# Patient Record
Sex: Female | Born: 1990 | Race: Asian | Hispanic: No | Marital: Single | Smoking: Never smoker
Health system: Southern US, Community
[De-identification: ages and names within clinical notes are randomized; demographics above are authoritative.]

## PROBLEM LIST (undated history)

## (undated) DIAGNOSIS — Z789 Other specified health status: Secondary | ICD-10-CM

## (undated) HISTORY — PX: NO PAST SURGERIES: SHX2092

## (undated) HISTORY — DX: Other specified health status: Z78.9

---

## 2013-08-23 ENCOUNTER — Ambulatory Visit: Payer: BC Managed Care – PPO | Admitting: Family Medicine

## 2013-08-23 VITALS — BP 102/70 | HR 125 | Temp 99.1°F | Resp 18 | Ht 59.0 in | Wt 107.0 lb

## 2013-08-23 DIAGNOSIS — R1110 Vomiting, unspecified: Secondary | ICD-10-CM

## 2013-08-23 DIAGNOSIS — Z3490 Encounter for supervision of normal pregnancy, unspecified, unspecified trimester: Secondary | ICD-10-CM

## 2013-08-23 DIAGNOSIS — Z331 Pregnant state, incidental: Secondary | ICD-10-CM

## 2013-08-23 DIAGNOSIS — R351 Nocturia: Secondary | ICD-10-CM

## 2013-08-23 LAB — POCT UA - MICROSCOPIC ONLY
Casts, Ur, LPF, POC: NEGATIVE
Crystals, Ur, HPF, POC: NEGATIVE
Mucus, UA: POSITIVE
Yeast, UA: NEGATIVE

## 2013-08-23 LAB — POCT URINALYSIS DIPSTICK
Bilirubin, UA: NEGATIVE
Blood, UA: NEGATIVE
Glucose, UA: NEGATIVE
Ketones, UA: 15
Leukocytes, UA: NEGATIVE
Nitrite, UA: NEGATIVE
Protein, UA: NEGATIVE
Spec Grav, UA: 1.015
Urobilinogen, UA: 0.2
pH, UA: 7

## 2013-08-23 LAB — POCT URINE PREGNANCY: Preg Test, Ur: POSITIVE

## 2013-08-23 MED ORDER — PROMETHAZINE HCL 12.5 MG PO TABS: 12.5000 mg | ORAL_TABLET | Freq: Three times a day (TID) | ORAL | Status: DC | PRN

## 2013-08-23 NOTE — Progress Notes (Addendum)
This chart was scribed for Caitlin Sidle, MD by Quintella Reichert, ED scribe.  This patient was seen in room [facility] Room 13 and the patient's care was started at 12:58 PM.   Patient ID: Caitlin Fuller MRN: [id number], DOB: [birth year], 21 y.o. Date of Encounter: 08/23/2013, 12:59 PM  Primary Physician: No primary provider on file.  Chief Complaint:  Chief Complaint  Patient presents with  . Emesis    vomiting every morning   . Urinary Frequency    urinating mor often at night   . Fatigue    HPI: 22 y.o. year old female presents with 2-3 month history of emesis, fatigue, and urinary frequency.  Pt vomits every morning.  Her breasts have also been swelling.  She denies breast tenderness.  She is not certain whether she is pregnant.  She is sexually active.  She took 2 pregnancy tests but she was not sure how to interpret them.  LNMP was at the end of July.  Her periods are normally regular.  She is not taking prenatal vitamins.   History reviewed. No pertinent past medical history.   Home Meds: Prior to Admission medications   Not on File    Allergies: No Known Allergies  History   Social History  . Marital Status: Single    Spouse Name: N/A    Number of Children: N/A  . Years of Education: N/A   Occupational History  . Not on file.   Social History Main Topics  . Smoking status: Never Smoker   . Smokeless tobacco: Not on file  . Alcohol Use: Not on file  . Drug Use: Not on file  . Sexual Activity: Not on file   Other Topics Concern  . Not on file   Social History Narrative  . No narrative on file     Review of Systems: Constitutional: positive for fatigue.  negative for chills, fever, night sweats or weight changes Cardiovascular: negative for chest pain or palpitations Respiratory: negative for hemoptysis, wheezing, or shortness of breath Abdominal: positive for emesis. negative for abdominal pain or diarrhea Genitourinary: positive for  frequency Musculoskeletal: Positive for breast swelling.  Negative for breast tenderness. Dermatological: negative for rash Neurologic: negative for headache   Physical Exam: Blood pressure 102/70, pulse 125, temperature 99.1 F (37.3 C), temperature source Oral, resp. rate 18, height 4\' 11"  (1.499 m), weight 107 lb (48.535 kg), last menstrual period 04/27/2013, SpO2 100.00%., Body mass index is 21.6 kg/(m^2). General: Well developed, well nourished, in no acute distress. Head: Normocephalic, atraumatic, eyes without discharge, sclera non-icteric, nares are congested. Bilateral auditory canals clear, TM's are without perforation, pearly grey with reflective cone of light bilaterally. Serous effusion bilaterally behind TM's. Maxillary sinus TTP. Oral cavity moist, dentition normal. Posterior pharynx with post nasal drip and mild erythema. No peritonsillar abscess or tonsillar exudate. Neck: Supple. No thyromegaly. Full ROM. No lymphadenopathy. Lungs: Clear bilaterally to auscultation without wheezes, rales, or rhonchi. Breathing is unlabored.  Heart: RRR with S1 S2. No murmurs, rubs, or gallops appreciated. Abdomen: Soft, non-tender, non-distended with normoactive bowel sounds. No hepatosplenomegaly. No rebound/guarding. No obvious abdominal masses. McBurney's, Rovsing's, Iliopsoas, and table jar all negative. Msk:  Strength and tone normal for age. Extremities: No clubbing or cyanosis. No edema. Neuro: Alert and oriented X 3. Moves all extremities spontaneously. CNII-XII grossly in tact. Psych:  Responds to questions appropriately with a normal affect.   Labs: Results for orders placed in visit on 08/23/13  POCT UA - MICROSCOPIC  ONLY      Result Value Range   WBC, Ur, HPF, POC 1-4     RBC, urine, microscopic 1-3     Bacteria, U Microscopic trace     Mucus, UA positive     Epithelial cells, urine per micros 3-8     Crystals, Ur, HPF, POC neg     Casts, Ur, LPF, POC neg     Yeast, UA neg     POCT URINALYSIS DIPSTICK      Result Value Range   Color, UA yellow     Clarity, UA clear     Glucose, UA neg     Bilirubin, UA neg     Ketones, UA 15     Spec Grav, UA 1.015     Blood, UA neg     pH, UA 7.0     Protein, UA neg     Urobilinogen, UA 0.2     Nitrite, UA neg     Leukocytes, UA Negative    POCT URINE PREGNANCY      Result Value Range   Preg Test, Ur Positive       ASSESSMENT AND PLAN:  22 y.o. year old female with pregnancy, unplanned, about 12 weeks Frequent urination at night - Plan: POCT UA - Microscopic Only, POCT urinalysis dipstick, POCT urine pregnancy, Ambulatory referral to Obstetrics / Gynecology, promethazine (PHENERGAN) 12.5 MG tablet  Vomiting - Plan: POCT UA - Microscopic Only, POCT urinalysis dipstick, POCT urine pregnancy, Ambulatory referral to Obstetrics / Gynecology, promethazine (PHENERGAN) 12.5 MG tablet  Pregnancy - Plan: Ambulatory referral to Obstetrics / Gynecology, promethazine (PHENERGAN) 12.5 MG tablet   Prenatal vits Rx written  Signed, Caitlin Sidle, MD 08/23/2013 12:59 PM

## 2013-08-23 NOTE — Patient Instructions (Signed)
[place] K? (Pregnancy) N?u b?n ?ang d? ??nh c [place], t?t nh?t nn s?p x?p m?t cu?c h?n ti?n mang thai v?i chuyn gia ch?m [place] y t? ?? th?o lu?n v? vi?c c m?t l?i s?ng lnh m?nh tr??c khi c [place]. Ch?ng h?n nh?, ch? ?? ?n u?ng, cn n?ng, t?p luy?n, u?ng vitamin tr??c khi sinh ??c bi?t l axit folic (gip ng?n ng?a khuy?t t?t no v t?y s?ng); trnh u?ng r??u, ht thu?c l v s? d?ng ma ty b?t h?p php; cc v?n ?? y t? (b?nh ti?u ???ng, co gi?t), ti?n s? gia ?nh c cc v?n ?? di truy?n, ?i?u ki?n lm vi?c v ch?ng ng?a. T?t h?n nn c ki?n th?c v? nh?ng ?i?u ny v c hnh ??ng ??i v?i chng tr??c khi mang [place].  Trong th?i k? mang [place], ?i?u quan tr?ng l tun theo nh?ng nguyn t?c nh?t ??nh ?? c m?t em b kh?e m?nh. ?i?u r?t quan tr?ng l c s? ch?m [place] t?t tr??c khi sinh v tun theo h??ng d?n c?a chuyn gia ch?m [place] y t?[place] thai bao g?m t?t c? cc ch?m [place] y t? b?n nh?n tr??c khi sinh. ?i?u ny gip phng ng?a cc v?n ?? trong qu trnh mang thai v sinh con.  H??NG D?N CH?M  T?I NH  B?t ??u cc l?n khm thai vo tu?n th? 12 c?a thai k? hay tr??c ? khi c th?. Cc ??t khm thai ny th??ng ???c ln l?ch hng thng tr??c. Trong 2 thng cu?i tr??c khi sinh, cc ??t khm thai ny th??ng xuyn h?n. ?i?u quan tr?ng l gi? ?ng l?ch h?n khm thai v?i Bc s? v tun theo cc ch? d?n c?a h? v? s? d?ng thu?c, cch v?n ??ng, v ch? ?? ?n.  Trong thai k?, cung c?p th?c ?n cho c? m? v b. ?n theo m?t ch? ?? ?n cn ??i, ??y ??. Ch?n cc lo?i th?c ph?m nh? th?t, c, s?a v cc s?n ph?m khc t? s?a, rau, tri cy, bnh m v th?c ?n lm t? h?t ng? c?c nguyn h?t. Bc s? s? cho bi?t m?c t?ng cn l t??ng d?a vo chi?u cao v ch? s? cn n?ng hi?n t?i. U?ng nhi?u n??c. C? g?ng u?ng tm ly n??c m?i ngy.  R??u c lin quan ??n m?t s? d? t?t b?m sinh k? c? h?i ch?ng l? thu?c r??u c?n c?a [place] nhi. T?t nh?t l tuy?t ??i trnh u?ng r??u. Ht thu?c l s? lm em b sinh ra nh? cn v sinh non. S? d?ng r??u v ch?t  nictin trong [place] k? c?ng lm t?ng ?ng k? kh? n?ng em b sinh ra s? b? l? thu?c vo cc ch?t ny sau khi l?n ln v c th? gp ph?n gy ra h?i ch?ng SIDS (H?i Ch?ng ??t T? Nh? Nhi).  Khng s? d?ng cc ch?t ma ty.  Ch? dng cc thu?c, ???c k toa hay ???c mua m khng c?n ??n thu?c, ???c khuy?n co b?i Bc s?. Cc thu?c khc c th? gy ra cc v?n ?? v? di truy?n v th? ch?t cho em b.  Th??ng c th? gi?m b?t ch?ng m?t m?i vo bu?i sng b?ng cch ?? bnh quy m?n c?nh gi??ng. ?n vi ci tr??c khi r?i kh?i gi??ng vo bu?i sng.  C th? ti?p t?c quan h? tnh d?c cho ??n g?n ngy sinh n?u khng c cc v?n ?? khc nh? r? (n??c ?i) t? mng ?i s?m (non), xu?t huy?t m ??o, giao h?p ?au hay ?  au b?ng.  V?n ??ng th??ng xuyn. Hy ki?m tra l?i v?i Bc s? n?u khng ch?c ch?n v? ?? an ton c?a m?t vi bi t?p v?n ??ng.  Khng dng b?n t?m nng, phng t?m h?i, v xng h?i. Nh?ng th? ny lm t?ng nguy c? b? chong ng?t hay b?t t?nh v gy t?n h?i ??n chnh b?n thn ng??i m? v em b. B?i l?i th t?t cho v?n ??ng thn th?. Ngh? ng?i nhi?u k? c? ch?p m?t vo bu?i chi?u khi c th?, ??c bi?t l vo ba thng cu?i [place] k?.  Trnh cc mi v ha ch?t ??c h?i.  Khng mang giy cao gt, chng c th? gy m?t th?ng b?ng v t ng.  ??ng nng v?t n?ng trn 5 pao. N?u nng nh?c b?t k? th? g, hy nng n ln b?ng l?c c?a chn v ?i, khng dng l?c ? l?ng.  Trnh nh?ng chuy?n ?i xa, ??c bi?t l vo ba thng cu?i [place] k?.  N?u ph?i ?i kh?i thnh ph? hay ti?u bang, nn mang theo b?n sao cc h? s? y t?. NH?P VI?N NGAY L?P T?C N?U:  Nhi?t ?? ?o d??i l??i cao trn 102 F (38.9 C) m khng r nguyn do, hay theo ?? ngh? c?a Bc s?.  Ch?y d?ch t? m ??o. N?u nghi ng? r? mng ?i, hy ?o thn nhi?t v thng bo cho Bc s? v? tr? s? nhi?t ?? ?o ???c khi g?i ?i?n tho?i cho h?.  C v?t mu hay xu?t huy?t m ??o. Bo cho Bc s? v? l??ng mu ch?y ra v s? b?ng v? sinh ? ???c s? d?ng.  Ti?p t?c b? i m?a v khng gi?m d ?  u?ng cc thu?c ???c ?? ngh?, hay i ra mu t??i ho?c i ra ch?t gi?ng nh? b c ph.  ?au b?ng vng trn r?n (th??ng v?).  Kh ch?u vng dy ch?ng trn ? vng b?ng d??i l nguyn nhn lnh tnh ph? bi?n gy ra ?au b?ng trong [place] k?. ?i?u ny v?n ph?i ???c Bc s? th?m khm.  C?m nh?n ???c cc c?n g t? cung.  Khng c?m nh?n ???c cc chuy?n ??ng c?a em b, hay chuy?n ??ng t h?n tr??c.  Ti?u ?au.  B? huy?t tr?ng b?t th??ng ? m ??o.  Tiu ch?y lin t?c.  ?au ??u d? d?i.  C cc v?n ?? v? th? gic.  B? y?u c?.  C?m th?y hoa m?t v chng m?t.  Kh th?.  ?au ng?c.  ?au l?ng, di chuy?n xu?ng chn v bn chn.  C?m th?y nh?p tim khng ??u hay r?t nhanh.  T?ng cn qu m?c trong th?i gian ng?n (5 pao trong 3 ??n 5 ngy).  B?n lin quan t?i tnh tr?ng b?o l?c gia ?nh. Document Released: 10/14/2005 Document Revised: 01/06/2012 [facility] Patient Information 2014 [place], [place].

## 2013-10-25 ENCOUNTER — Ambulatory Visit: Payer: BC Managed Care – PPO

## 2013-10-28 NOTE — L&D Delivery Note (Signed)
I was present for delivery and agree with note above. Caitlin Fuller,Caitlin Fuller  

## 2013-10-28 NOTE — L&D Delivery Note (Signed)
Delivery Note At 6:04 PM a healthy female was delivered via Vaginal, Spontaneous Delivery (Presentation: Middle Occiput Anterior).  APGAR: 1, 9; weight 7 lb 6.7 oz (3365 g).   Placenta status: Abnormal, Manual removal.  Cord: 3 vessels with the following complications: None.  Cord pH: not obtained  Anesthesia: None  Episiotomy: None Lacerations: 2nd degree;Perineal Suture Repair: 3.0 Monocyrl Est. Blood Loss (500mL):   Precipitous delivery as she advanced quickly from 4 cm to complete. Upon arrival baby's head was crowning and mother pushed with good maternal effort to deliver a healthy female. Baby delivered without difficulty, cord was clamped immediately due to poor infant tone and was taken to warmer. APGAR 1 at 1 minute likely due to fentanyl dose given ~ 30 minutes prior to delivery. NICU team was called, but baby responded well to stimulation and minimal bag ventilation with APGAR 9 at 5 minutes. She had a retained placenta that was manually removed by Dr Shawnie PonsPratt. Placenta was fragmented and required several searches of the uterus to remove retained tissue. 3V cord was noted. She suffered a 2nd degree perineal laceration that was repaired by Dr Shawnie PonsPratt in the usually fashion. EBL was estimated at 500 cc and Cytotec was given. Bleeding was minimal s/p removal of placenta.  Mom to postpartum and baby to skin to skin.   Mom to postpartum.  Baby to Couplet care / Skin to Skin.  Wenda LowJoyner, Juda Lajeunesse 01/11/2014, 7:02 PM

## 2013-11-17 ENCOUNTER — Ambulatory Visit (INDEPENDENT_AMBULATORY_CARE_PROVIDER_SITE_OTHER): Payer: BC Managed Care – PPO | Admitting: Advanced Practice Midwife

## 2013-11-17 ENCOUNTER — Encounter: Payer: Self-pay | Admitting: Advanced Practice Midwife

## 2013-11-17 VITALS — BP 108/76 | Temp 96.7°F | Wt 133.8 lb

## 2013-11-17 DIAGNOSIS — Z113 Encounter for screening for infections with a predominantly sexual mode of transmission: Secondary | ICD-10-CM

## 2013-11-17 DIAGNOSIS — O219 Vomiting of pregnancy, unspecified: Secondary | ICD-10-CM

## 2013-11-17 DIAGNOSIS — Z124 Encounter for screening for malignant neoplasm of cervix: Secondary | ICD-10-CM

## 2013-11-17 DIAGNOSIS — Z23 Encounter for immunization: Secondary | ICD-10-CM

## 2013-11-17 DIAGNOSIS — Z3400 Encounter for supervision of normal first pregnancy, unspecified trimester: Secondary | ICD-10-CM

## 2013-11-17 LAB — POCT URINALYSIS DIP (DEVICE)
BILIRUBIN URINE: NEGATIVE
Glucose, UA: NEGATIVE mg/dL
Hgb urine dipstick: NEGATIVE
KETONES UR: NEGATIVE mg/dL
Nitrite: NEGATIVE
PH: 7 (ref 5.0–8.0)
PROTEIN: NEGATIVE mg/dL
SPECIFIC GRAVITY, URINE: 1.02 (ref 1.005–1.030)
Urobilinogen, UA: 1 mg/dL (ref 0.0–1.0)

## 2013-11-17 LAB — HIV ANTIBODY (ROUTINE TESTING W REFLEX): HIV: NONREACTIVE

## 2013-11-17 MED ORDER — PROMETHAZINE HCL 25 MG PO TABS: 12.5000 mg | ORAL_TABLET | Freq: Four times a day (QID) | ORAL | Status: DC | PRN

## 2013-11-17 NOTE — Progress Notes (Signed)
   Subjective:    An ChinaHong Katherene Pontohi Haughn is a G1P0 8345w2d being seen today for her first obstetrical visit.  Her obstetrical history is significant for none G1. Patient does intend to breast feed. Pregnancy history fully reviewed.  Patient reports nausea.  Filed Vitals:   11/17/13 0944  BP: 108/76  Temp: 96.7 F (35.9 C)  Weight: 133 lb 12.8 oz (60.691 kg)    HISTORY: OB History  Gravida Para Term Preterm AB SAB TAB Ectopic Multiple Living  1             # Outcome Date GA Lbr Len/2nd Weight Sex Delivery Anes PTL Lv  1 CUR              Past Medical History  Diagnosis Date  . Medical history non-contributory    Past Surgical History  Procedure Laterality Date  . No past surgeries     History reviewed. No pertinent family history.   Exam    Uterus:  Fundal Height: 24 cm  Pelvic Exam:    Perineum: No Hemorrhoids, Normal Perineum   Vulva: normal   Vagina:  normal mucosa, normal discharge   pH:    Cervix: no bleeding following Pap, no cervical motion tenderness and nulliparous appearance   Adnexa: normal adnexa and difficult to palpate r/t advanced gestation   Bony Pelvis: average  System: Breast:  normal appearance, no masses or tenderness   Skin: normal coloration and turgor, no rashes    Neurologic: oriented, normal, gait normal; reflexes normal and symmetric   Extremities: normal strength, tone, and muscle mass, ROM of all joints is normal   HEENT neck supple with midline trachea and thyroid without masses   Mouth/Teeth mucous membranes moist, pharynx normal without lesions and dental hygiene good   Neck supple and no masses   Cardiovascular: regular rate and rhythm   Respiratory:  appears well, vitals normal, no respiratory distress, acyanotic, normal RR, ear and throat exam is normal, neck free of mass or lymphadenopathy, chest clear, no wheezing, crepitations, rhonchi, normal symmetric air entry   Abdomen: soft, non-tender; bowel sounds normal; no masses,  no  organomegaly   Urinary: urethral meatus normal      Assessment:    Pregnancy: G1P0 Patient Active Problem List   Diagnosis Date Noted  . Supervision of normal first pregnancy 11/17/2013        Plan:     Initial labs drawn. Prenatal vitamins. Phenergan 12.5-25 mg PO Q 6 hours PRN sent to pharmacy Problem list reviewed and updated. Genetic Screening discussed First Screen and Integrated Screen: declined.  Ultrasound discussed; fetal survey: ordered.  Follow up in 4 weeks. 50% of 30 min visit spent on counseling and coordination of care.     LEFTWICH-KIRBY, Elior Robinette 11/17/2013

## 2013-11-17 NOTE — Progress Notes (Signed)
P=105  initial prenatal visit

## 2013-11-17 NOTE — Progress Notes (Signed)
Patient and support person called back from pharmacy stating expecting a refill of phenergan- discussed with provider and she will send in prescription.

## 2013-11-18 LAB — PRESCRIPTION MONITORING PROFILE (19 PANEL)
Amphetamine/Meth: NEGATIVE ng/mL
BUPRENORPHINE, URINE: NEGATIVE ng/mL
Barbiturate Screen, Urine: NEGATIVE ng/mL
Benzodiazepine Screen, Urine: NEGATIVE ng/mL
CANNABINOID SCRN UR: NEGATIVE ng/mL
COCAINE METABOLITES: NEGATIVE ng/mL
Carisoprodol, Urine: NEGATIVE ng/mL
Creatinine, Urine: 97.75 mg/dL (ref >20.0)
ECSTASY: NEGATIVE ng/mL
Fentanyl, Ur: NEGATIVE ng/mL
MEPERIDINE UR: NEGATIVE ng/mL
METHAQUALONE SCREEN (URINE): NEGATIVE ng/mL
Methadone Screen, Urine: NEGATIVE ng/mL
Nitrites, Initial: NEGATIVE ug/mL
Opiate Screen, Urine: NEGATIVE ng/mL
Oxycodone Screen, Ur: NEGATIVE ng/mL
Phencyclidine, Ur: NEGATIVE ng/mL
Propoxyphene: NEGATIVE ng/mL
Tapentadol, urine: NEGATIVE ng/mL
Tramadol Scrn, Ur: NEGATIVE ng/mL
ZOLPIDEM, URINE: NEGATIVE ng/mL
pH, Initial: 6.9 pH (ref 4.5–8.9)

## 2013-11-18 LAB — OBSTETRIC PANEL
ANTIBODY SCREEN: NEGATIVE
BASOS ABS: 0.1 10*3/uL (ref 0.0–0.1)
Basophils Relative: 0 % (ref 0–1)
EOS PCT: 2 % (ref 0–5)
Eosinophils Absolute: 0.2 10*3/uL (ref 0.0–0.7)
HCT: 36.6 % (ref 36.0–46.0)
HEMOGLOBIN: 12 g/dL (ref 12.0–15.0)
Hepatitis B Surface Ag: NEGATIVE
Lymphocytes Relative: 21 % (ref 12–46)
Lymphs Abs: 2.5 10*3/uL (ref 0.7–4.0)
MCH: 28.6 pg (ref 26.0–34.0)
MCHC: 32.8 g/dL (ref 30.0–36.0)
MCV: 87.1 fL (ref 78.0–100.0)
MONOS PCT: 7 % (ref 3–12)
Monocytes Absolute: 0.9 10*3/uL (ref 0.1–1.0)
Neutro Abs: 8.4 10*3/uL — ABNORMAL HIGH (ref 1.7–7.7)
Neutrophils Relative %: 70 % (ref 43–77)
Platelets: 282 10*3/uL (ref 150–400)
RBC: 4.2 MIL/uL (ref 3.87–5.11)
RDW: 13.5 % (ref 11.5–15.5)
Rh Type: POSITIVE
Rubella: 2.22 Index — ABNORMAL HIGH (ref <0.90)
WBC: 12 10*3/uL — ABNORMAL HIGH (ref 4.0–10.5)

## 2013-11-19 LAB — CULTURE, OB URINE
Colony Count: NO GROWTH
Organism ID, Bacteria: NO GROWTH

## 2013-11-30 ENCOUNTER — Other Ambulatory Visit: Payer: Self-pay | Admitting: Advanced Practice Midwife

## 2013-11-30 ENCOUNTER — Ambulatory Visit (HOSPITAL_COMMUNITY)
Admission: RE | Admit: 2013-11-30 | Discharge: 2013-11-30 | Disposition: A | Discharge status: Home / Self Care | Payer: BC Managed Care – PPO | Source: Ambulatory Visit | Attending: Advanced Practice Midwife | Admitting: Advanced Practice Midwife

## 2013-11-30 DIAGNOSIS — Z3400 Encounter for supervision of normal first pregnancy, unspecified trimester: Secondary | ICD-10-CM

## 2013-11-30 DIAGNOSIS — Z3689 Encounter for other specified antenatal screening: Secondary | ICD-10-CM | POA: Insufficient documentation

## 2013-12-01 ENCOUNTER — Encounter: Payer: Self-pay | Admitting: Advanced Practice Midwife

## 2013-12-15 ENCOUNTER — Encounter: Payer: Self-pay | Admitting: Obstetrics and Gynecology

## 2013-12-15 ENCOUNTER — Ambulatory Visit (INDEPENDENT_AMBULATORY_CARE_PROVIDER_SITE_OTHER): Payer: BC Managed Care – PPO | Admitting: Obstetrics and Gynecology

## 2013-12-15 VITALS — BP 117/71 | Temp 97.0°F | Wt 137.8 lb

## 2013-12-15 DIAGNOSIS — Z3400 Encounter for supervision of normal first pregnancy, unspecified trimester: Secondary | ICD-10-CM

## 2013-12-15 DIAGNOSIS — O0930 Supervision of pregnancy with insufficient antenatal care, unspecified trimester: Secondary | ICD-10-CM

## 2013-12-15 DIAGNOSIS — Z3403 Encounter for supervision of normal first pregnancy, third trimester: Secondary | ICD-10-CM

## 2013-12-15 DIAGNOSIS — O0933 Supervision of pregnancy with insufficient antenatal care, third trimester: Secondary | ICD-10-CM

## 2013-12-15 LAB — CBC
HCT: 36.6 % (ref 36.0–46.0)
Hemoglobin: 11.5 g/dL — ABNORMAL LOW (ref 12.0–15.0)
MCH: 26.3 pg (ref 26.0–34.0)
MCHC: 31.4 g/dL (ref 30.0–36.0)
MCV: 83.6 fL (ref 78.0–100.0)
PLATELETS: 314 10*3/uL (ref 150–400)
RBC: 4.38 MIL/uL (ref 3.87–5.11)
RDW: 14 % (ref 11.5–15.5)
WBC: 10.3 10*3/uL (ref 4.0–10.5)

## 2013-12-15 LAB — POCT URINALYSIS DIP (DEVICE)
BILIRUBIN URINE: NEGATIVE
Glucose, UA: NEGATIVE mg/dL
KETONES UR: NEGATIVE mg/dL
Nitrite: NEGATIVE
PH: 6.5 (ref 5.0–8.0)
Protein, ur: 30 mg/dL — AB
Urobilinogen, UA: 1 mg/dL (ref 0.0–1.0)

## 2013-12-15 LAB — OB RESULTS CONSOLE GBS: STREP GROUP B AG: NEGATIVE

## 2013-12-15 LAB — OB RESULTS CONSOLE GC/CHLAMYDIA
Chlamydia: NEGATIVE
Gonorrhea: NEGATIVE

## 2013-12-15 NOTE — Patient Instructions (Signed)
Third Trimester of Pregnancy  The third trimester is from week 29 through week 42, months 7 through 9. The third trimester is a time when the fetus is growing rapidly. At the end of the ninth month, the fetus is about 20 inches in length and weighs 6 10 pounds.   BODY CHANGES  Your body goes through many changes during pregnancy. The changes vary from woman to woman.    Your weight will continue to increase. You can expect to gain 25 35 pounds (11 16 kg) by the end of the pregnancy.   You may begin to get stretch marks on your hips, abdomen, and breasts.   You may urinate more often because the fetus is moving lower into your pelvis and pressing on your bladder.   You may develop or continue to have heartburn as a result of your pregnancy.   You may develop constipation because certain hormones are causing the muscles that push waste through your intestines to slow down.   You may develop hemorrhoids or swollen, bulging veins (varicose veins).   You may have pelvic pain because of the weight gain and pregnancy hormones relaxing your joints between the bones in your pelvis. Back aches may result from over exertion of the muscles supporting your posture.   Your breasts will continue to grow and be tender. A yellow discharge may leak from your breasts called colostrum.   Your belly button may stick out.   You may feel short of breath because of your expanding uterus.   You may notice the fetus "dropping," or moving lower in your abdomen.   You may have a bloody mucus discharge. This usually occurs a few days to a week before labor begins.   Your cervix becomes thin and soft (effaced) near your due date.  WHAT TO EXPECT AT YOUR PRENATAL EXAMS   You will have prenatal exams every 2 weeks until week 36. Then, you will have weekly prenatal exams. During a routine prenatal visit:   You will be weighed to make sure you and the fetus are growing normally.   Your blood pressure is taken.   Your abdomen will be  measured to track your baby's growth.   The fetal heartbeat will be listened to.   Any test results from the previous visit will be discussed.   You may have a cervical check near your due date to see if you have effaced.  At around 36 weeks, your caregiver will check your cervix. At the same time, your caregiver will also perform a test on the secretions of the vaginal tissue. This test is to determine if a type of bacteria, Group B streptococcus, is present. Your caregiver will explain this further.  Your caregiver may ask you:   What your birth plan is.   How you are feeling.   If you are feeling the baby move.   If you have had any abnormal symptoms, such as leaking fluid, bleeding, severe headaches, or abdominal cramping.   If you have any questions.  Other tests or screenings that may be performed during your third trimester include:   Blood tests that check for low iron levels (anemia).   Fetal testing to check the health, activity level, and growth of the fetus. Testing is done if you have certain medical conditions or if there are problems during the pregnancy.  FALSE LABOR  You may feel small, irregular contractions that eventually go away. These are called Braxton Hicks contractions, or   false labor. Contractions may last for hours, days, or even weeks before true labor sets in. If contractions come at regular intervals, intensify, or become painful, it is best to be seen by your caregiver.   SIGNS OF LABOR    Menstrual-like cramps.   Contractions that are 5 minutes apart or less.   Contractions that start on the top of the uterus and spread down to the lower abdomen and back.   A sense of increased pelvic pressure or back pain.   A watery or bloody mucus discharge that comes from the vagina.  If you have any of these signs before the 37th week of pregnancy, call your caregiver right away. You need to go to the hospital to get checked immediately.  HOME CARE INSTRUCTIONS    Avoid all  smoking, herbs, alcohol, and unprescribed drugs. These chemicals affect the formation and growth of the baby.   Follow your caregiver's instructions regarding medicine use. There are medicines that are either safe or unsafe to take during pregnancy.   Exercise only as directed by your caregiver. Experiencing uterine cramps is a good sign to stop exercising.   Continue to eat regular, healthy meals.   Wear a good support bra for breast tenderness.   Do not use hot tubs, steam rooms, or saunas.   Wear your seat belt at all times when driving.   Avoid raw meat, uncooked cheese, cat litter boxes, and soil used by cats. These carry germs that can cause birth defects in the baby.   Take your prenatal vitamins.   Try taking a stool softener (if your caregiver approves) if you develop constipation. Eat more high-fiber foods, such as fresh vegetables or fruit and whole grains. Drink plenty of fluids to keep your urine clear or pale yellow.   Take warm sitz baths to soothe any pain or discomfort caused by hemorrhoids. Use hemorrhoid cream if your caregiver approves.   If you develop varicose veins, wear support hose. Elevate your feet for 15 minutes, 3 4 times a day. Limit salt in your diet.   Avoid heavy lifting, wear low heal shoes, and practice good posture.   Rest a lot with your legs elevated if you have leg cramps or low back pain.   Visit your dentist if you have not gone during your pregnancy. Use a soft toothbrush to brush your teeth and be gentle when you floss.   A sexual relationship may be continued unless your caregiver directs you otherwise.   Do not travel far distances unless it is absolutely necessary and only with the approval of your caregiver.   Take prenatal classes to understand, practice, and ask questions about the labor and delivery.   Make a trial run to the hospital.   Pack your hospital bag.   Prepare the baby's nursery.   Continue to go to all your prenatal visits as directed  by your caregiver.  SEEK MEDICAL CARE IF:   You are unsure if you are in labor or if your water has broken.   You have dizziness.   You have mild pelvic cramps, pelvic pressure, or nagging pain in your abdominal area.   You have persistent nausea, vomiting, or diarrhea.   You have a bad smelling vaginal discharge.   You have pain with urination.  SEEK IMMEDIATE MEDICAL CARE IF:    You have a fever.   You are leaking fluid from your vagina.   You have spotting or bleeding from your vagina.     You have severe abdominal cramping or pain.   You have rapid weight loss or gain.   You have shortness of breath with chest pain.   You notice sudden or extreme swelling of your face, hands, ankles, feet, or legs.   You have not felt your baby move in over an hour.   You have severe headaches that do not go away with medicine.   You have vision changes.  Document Released: 10/08/2001 Document Revised: 06/16/2013 Document Reviewed: 12/15/2012  [employer] Patient Information 2014 [employer].

## 2013-12-15 NOTE — Progress Notes (Signed)
Pulse- 101 

## 2013-12-15 NOTE — Progress Notes (Signed)
Here as appointed. No concerns or C/O. Apparently was thought to be 18 wks last visit but was 32 wks. US reviewed> all normal, needs F/U anatomy> scheduled.  PN labs reviewed> get glucola today. Offered tDaP.Through interpreter reviewed s/sx labor and danger signs.

## 2013-12-16 LAB — GLUCOSE TOLERANCE, 1 HOUR (50G) W/O FASTING: Glucose, 1 Hour GTT: 113 mg/dL (ref 70–140)

## 2013-12-16 LAB — HIV ANTIBODY (ROUTINE TESTING W REFLEX): HIV: NONREACTIVE

## 2013-12-16 LAB — GC/CHLAMYDIA PROBE AMP
CT Probe RNA: NEGATIVE
GC PROBE AMP APTIMA: NEGATIVE

## 2013-12-16 LAB — RPR

## 2013-12-17 LAB — CULTURE, BETA STREP (GROUP B ONLY)

## 2013-12-20 ENCOUNTER — Ambulatory Visit (HOSPITAL_COMMUNITY): Payer: BC Managed Care – PPO

## 2013-12-21 ENCOUNTER — Ambulatory Visit (HOSPITAL_COMMUNITY): Payer: BC Managed Care – PPO

## 2013-12-22 ENCOUNTER — Ambulatory Visit (INDEPENDENT_AMBULATORY_CARE_PROVIDER_SITE_OTHER): Payer: BC Managed Care – PPO | Admitting: Obstetrics & Gynecology

## 2013-12-22 VITALS — BP 108/73 | Temp 96.5°F | Wt 141.6 lb

## 2013-12-22 DIAGNOSIS — Z789 Other specified health status: Secondary | ICD-10-CM

## 2013-12-22 DIAGNOSIS — Z609 Problem related to social environment, unspecified: Secondary | ICD-10-CM

## 2013-12-22 DIAGNOSIS — O0930 Supervision of pregnancy with insufficient antenatal care, unspecified trimester: Secondary | ICD-10-CM

## 2013-12-22 DIAGNOSIS — O0933 Supervision of pregnancy with insufficient antenatal care, third trimester: Secondary | ICD-10-CM

## 2013-12-22 DIAGNOSIS — Z3400 Encounter for supervision of normal first pregnancy, unspecified trimester: Secondary | ICD-10-CM

## 2013-12-22 LAB — POCT URINALYSIS DIP (DEVICE)
Bilirubin Urine: NEGATIVE
Glucose, UA: NEGATIVE mg/dL
Ketones, ur: NEGATIVE mg/dL
Nitrite: NEGATIVE
Protein, ur: 30 mg/dL — AB
Specific Gravity, Urine: 1.025 (ref 1.005–1.030)
Urobilinogen, UA: 2 mg/dL — ABNORMAL HIGH (ref 0.0–1.0)
pH: 6.5 (ref 5.0–8.0)

## 2013-12-22 NOTE — Progress Notes (Signed)
[place] interpreter present.  GBS,  GC and Chlam cultures all negative. No complaints or concerns.  Fetal movement and labor precautions reviewed.

## 2013-12-22 NOTE — Patient Instructions (Signed)
Return to clinic for any obstetric concerns or go to [facility] for evaluation  

## 2013-12-22 NOTE — Progress Notes (Signed)
Pulse: 102

## 2013-12-23 DIAGNOSIS — Z789 Other specified health status: Secondary | ICD-10-CM | POA: Insufficient documentation

## 2013-12-27 ENCOUNTER — Ambulatory Visit (INDEPENDENT_AMBULATORY_CARE_PROVIDER_SITE_OTHER): Payer: BC Managed Care – PPO | Admitting: Family Medicine

## 2013-12-27 VITALS — BP 121/78 | Temp 97.2°F | Wt 144.2 lb

## 2013-12-27 DIAGNOSIS — O0933 Supervision of pregnancy with insufficient antenatal care, third trimester: Secondary | ICD-10-CM

## 2013-12-27 DIAGNOSIS — Z3400 Encounter for supervision of normal first pregnancy, unspecified trimester: Secondary | ICD-10-CM

## 2013-12-27 DIAGNOSIS — Z609 Problem related to social environment, unspecified: Secondary | ICD-10-CM

## 2013-12-27 DIAGNOSIS — O0930 Supervision of pregnancy with insufficient antenatal care, unspecified trimester: Secondary | ICD-10-CM

## 2013-12-27 DIAGNOSIS — Z789 Other specified health status: Secondary | ICD-10-CM

## 2013-12-27 LAB — POCT URINALYSIS DIP (DEVICE)
Bilirubin Urine: NEGATIVE
GLUCOSE, UA: NEGATIVE mg/dL
Hgb urine dipstick: NEGATIVE
Ketones, ur: NEGATIVE mg/dL
NITRITE: NEGATIVE
PROTEIN: NEGATIVE mg/dL
SPECIFIC GRAVITY, URINE: 1.025 (ref 1.005–1.030)
UROBILINOGEN UA: 1 mg/dL (ref 0.0–1.0)
pH: 6.5 (ref 5.0–8.0)

## 2013-12-27 NOTE — Patient Instructions (Signed)
Third Trimester of Pregnancy  The third trimester is from week 29 through week 42, months 7 through 9. The third trimester is a time when the fetus is growing rapidly. At the end of the ninth month, the fetus is about 20 inches in length and weighs 6 10 pounds.   BODY CHANGES  Your body goes through many changes during pregnancy. The changes vary from woman to woman.    Your weight will continue to increase. You can expect to gain 25 35 pounds (11 16 kg) by the end of the pregnancy.   You may begin to get stretch marks on your hips, abdomen, and breasts.   You may urinate more often because the fetus is moving lower into your pelvis and pressing on your bladder.   You may develop or continue to have heartburn as a result of your pregnancy.   You may develop constipation because certain hormones are causing the muscles that push waste through your intestines to slow down.   You may develop hemorrhoids or swollen, bulging veins (varicose veins).   You may have pelvic pain because of the weight gain and pregnancy hormones relaxing your joints between the bones in your pelvis. Back aches may result from over exertion of the muscles supporting your posture.   Your breasts will continue to grow and be tender. A yellow discharge may leak from your breasts called colostrum.   Your belly button may stick out.   You may feel short of breath because of your expanding uterus.   You may notice the fetus "dropping," or moving lower in your abdomen.   You may have a bloody mucus discharge. This usually occurs a few days to a week before labor begins.   Your cervix becomes thin and soft (effaced) near your due date.  WHAT TO EXPECT AT YOUR PRENATAL EXAMS   You will have prenatal exams every 2 weeks until week 36. Then, you will have weekly prenatal exams. During a routine prenatal visit:   You will be weighed to make sure you and the fetus are growing normally.   Your blood pressure is taken.   Your abdomen will be  measured to track your baby's growth.   The fetal heartbeat will be listened to.   Any test results from the previous visit will be discussed.   You may have a cervical check near your due date to see if you have effaced.  At around 36 weeks, your caregiver will check your cervix. At the same time, your caregiver will also perform a test on the secretions of the vaginal tissue. This test is to determine if a type of bacteria, Group B streptococcus, is present. Your caregiver will explain this further.  Your caregiver may ask you:   What your birth plan is.   How you are feeling.   If you are feeling the baby move.   If you have had any abnormal symptoms, such as leaking fluid, bleeding, severe headaches, or abdominal cramping.   If you have any questions.  Other tests or screenings that may be performed during your third trimester include:   Blood tests that check for low iron levels (anemia).   Fetal testing to check the health, activity level, and growth of the fetus. Testing is done if you have certain medical conditions or if there are problems during the pregnancy.  FALSE LABOR  You may feel small, irregular contractions that eventually go away. These are called Braxton Hicks contractions, or   false labor. Contractions may last for hours, days, or even weeks before true labor sets in. If contractions come at regular intervals, intensify, or become painful, it is best to be seen by your caregiver.   SIGNS OF LABOR    Menstrual-like cramps.   Contractions that are 5 minutes apart or less.   Contractions that start on the top of the uterus and spread down to the lower abdomen and back.   A sense of increased pelvic pressure or back pain.   A watery or bloody mucus discharge that comes from the vagina.  If you have any of these signs before the 37th week of pregnancy, call your caregiver right away. You need to go to the hospital to get checked immediately.  HOME CARE INSTRUCTIONS    Avoid all  smoking, herbs, alcohol, and unprescribed drugs. These chemicals affect the formation and growth of the baby.   Follow your caregiver's instructions regarding medicine use. There are medicines that are either safe or unsafe to take during pregnancy.   Exercise only as directed by your caregiver. Experiencing uterine cramps is a good sign to stop exercising.   Continue to eat regular, healthy meals.   Wear a good support bra for breast tenderness.   Do not use hot tubs, steam rooms, or saunas.   Wear your seat belt at all times when driving.   Avoid raw meat, uncooked cheese, cat litter boxes, and soil used by cats. These carry germs that can cause birth defects in the baby.   Take your prenatal vitamins.   Try taking a stool softener (if your caregiver approves) if you develop constipation. Eat more high-fiber foods, such as fresh vegetables or fruit and whole grains. Drink plenty of fluids to keep your urine clear or pale yellow.   Take warm sitz baths to soothe any pain or discomfort caused by hemorrhoids. Use hemorrhoid cream if your caregiver approves.   If you develop varicose veins, wear support hose. Elevate your feet for 15 minutes, 3 4 times a day. Limit salt in your diet.   Avoid heavy lifting, wear low heal shoes, and practice good posture.   Rest a lot with your legs elevated if you have leg cramps or low back pain.   Visit your dentist if you have not gone during your pregnancy. Use a soft toothbrush to brush your teeth and be gentle when you floss.   A sexual relationship may be continued unless your caregiver directs you otherwise.   Do not travel far distances unless it is absolutely necessary and only with the approval of your caregiver.   Take prenatal classes to understand, practice, and ask questions about the labor and delivery.   Make a trial run to the hospital.   Pack your hospital bag.   Prepare the baby's nursery.   Continue to go to all your prenatal visits as directed  by your caregiver.  SEEK MEDICAL CARE IF:   You are unsure if you are in labor or if your water has broken.   You have dizziness.   You have mild pelvic cramps, pelvic pressure, or nagging pain in your abdominal area.   You have persistent nausea, vomiting, or diarrhea.   You have a bad smelling vaginal discharge.   You have pain with urination.  SEEK IMMEDIATE MEDICAL CARE IF:    You have a fever.   You are leaking fluid from your vagina.   You have spotting or bleeding from your vagina.     You have severe abdominal cramping or pain.   You have rapid weight loss or gain.   You have shortness of breath with chest pain.   You notice sudden or extreme swelling of your face, hands, ankles, feet, or legs.   You have not felt your baby move in over an hour.   You have severe headaches that do not go away with medicine.   You have vision changes.  Document Released: 10/08/2001 Document Revised: 06/16/2013 Document Reviewed: 12/15/2012  [employer] Patient Information 2014 [employer].

## 2013-12-27 NOTE — Progress Notes (Signed)
S: 23 yo G1 @ 4182w4d here for ROBV - doing well Cant sleep on right side anymore because she feels the tightness of baby there on her uterus.  - no ctx, lof, vb +FM.   O: see flowsheet  A/P - doing well - no complaints - f/u in 1 week  Visit conducted with [place] interpreter

## 2013-12-27 NOTE — Progress Notes (Signed)
Pulse-  103 Pt reports feeling tightness

## 2013-12-28 ENCOUNTER — Ambulatory Visit (HOSPITAL_COMMUNITY)
Admission: RE | Admit: 2013-12-28 | Discharge: 2013-12-28 | Disposition: A | Discharge status: Home / Self Care | Payer: BC Managed Care – PPO | Source: Ambulatory Visit | Attending: Obstetrics and Gynecology | Admitting: Obstetrics and Gynecology

## 2013-12-28 DIAGNOSIS — Z3400 Encounter for supervision of normal first pregnancy, unspecified trimester: Secondary | ICD-10-CM

## 2013-12-28 DIAGNOSIS — Z3689 Encounter for other specified antenatal screening: Secondary | ICD-10-CM | POA: Insufficient documentation

## 2013-12-28 DIAGNOSIS — O0933 Supervision of pregnancy with insufficient antenatal care, third trimester: Secondary | ICD-10-CM

## 2014-01-03 ENCOUNTER — Ambulatory Visit (INDEPENDENT_AMBULATORY_CARE_PROVIDER_SITE_OTHER): Payer: BC Managed Care – PPO | Admitting: Family Medicine

## 2014-01-03 VITALS — BP 115/75 | Temp 98.0°F | Wt 146.4 lb

## 2014-01-03 DIAGNOSIS — O0933 Supervision of pregnancy with insufficient antenatal care, third trimester: Secondary | ICD-10-CM

## 2014-01-03 DIAGNOSIS — O0930 Supervision of pregnancy with insufficient antenatal care, unspecified trimester: Secondary | ICD-10-CM

## 2014-01-03 DIAGNOSIS — Z3400 Encounter for supervision of normal first pregnancy, unspecified trimester: Secondary | ICD-10-CM

## 2014-01-03 DIAGNOSIS — Z609 Problem related to social environment, unspecified: Secondary | ICD-10-CM

## 2014-01-03 DIAGNOSIS — Z789 Other specified health status: Secondary | ICD-10-CM

## 2014-01-03 LAB — POCT URINALYSIS DIP (DEVICE)
BILIRUBIN URINE: NEGATIVE
GLUCOSE, UA: NEGATIVE mg/dL
HGB URINE DIPSTICK: NEGATIVE
Ketones, ur: NEGATIVE mg/dL
NITRITE: NEGATIVE
Protein, ur: 30 mg/dL — AB
Specific Gravity, Urine: 1.02 (ref 1.005–1.030)
UROBILINOGEN UA: 0.2 mg/dL (ref 0.0–1.0)
pH: 6 (ref 5.0–8.0)

## 2014-01-03 NOTE — Addendum Note (Signed)
Addended by: Vale HavenBECK, Faron Whitelock L on: 01/03/2014 03:04 PM   Modules accepted: Orders

## 2014-01-03 NOTE — Patient Instructions (Signed)
Third Trimester of Pregnancy  The third trimester is from week 29 through week 42, months 7 through 9. The third trimester is a time when the fetus is growing rapidly. At the end of the ninth month, the fetus is about 20 inches in length and weighs 6 10 pounds.   BODY CHANGES  Your body goes through many changes during pregnancy. The changes vary from woman to woman.    Your weight will continue to increase. You can expect to gain 25 35 pounds (11 16 kg) by the end of the pregnancy.   You may begin to get stretch marks on your hips, abdomen, and breasts.   You may urinate more often because the fetus is moving lower into your pelvis and pressing on your bladder.   You may develop or continue to have heartburn as a result of your pregnancy.   You may develop constipation because certain hormones are causing the muscles that push waste through your intestines to slow down.   You may develop hemorrhoids or swollen, bulging veins (varicose veins).   You may have pelvic pain because of the weight gain and pregnancy hormones relaxing your joints between the bones in your pelvis. Back aches may result from over exertion of the muscles supporting your posture.   Your breasts will continue to grow and be tender. A yellow discharge may leak from your breasts called colostrum.   Your belly button may stick out.   You may feel short of breath because of your expanding uterus.   You may notice the fetus "dropping," or moving lower in your abdomen.   You may have a bloody mucus discharge. This usually occurs a few days to a week before labor begins.   Your cervix becomes thin and soft (effaced) near your due date.  WHAT TO EXPECT AT YOUR PRENATAL EXAMS   You will have prenatal exams every 2 weeks until week 36. Then, you will have weekly prenatal exams. During a routine prenatal visit:   You will be weighed to make sure you and the fetus are growing normally.   Your blood pressure is taken.   Your abdomen will be  measured to track your baby's growth.   The fetal heartbeat will be listened to.   Any test results from the previous visit will be discussed.   You may have a cervical check near your due date to see if you have effaced.  At around 36 weeks, your caregiver will check your cervix. At the same time, your caregiver will also perform a test on the secretions of the vaginal tissue. This test is to determine if a type of bacteria, Group B streptococcus, is present. Your caregiver will explain this further.  Your caregiver may ask you:   What your birth plan is.   How you are feeling.   If you are feeling the baby move.   If you have had any abnormal symptoms, such as leaking fluid, bleeding, severe headaches, or abdominal cramping.   If you have any questions.  Other tests or screenings that may be performed during your third trimester include:   Blood tests that check for low iron levels (anemia).   Fetal testing to check the health, activity level, and growth of the fetus. Testing is done if you have certain medical conditions or if there are problems during the pregnancy.  FALSE LABOR  You may feel small, irregular contractions that eventually go away. These are called Braxton Hicks contractions, or   false labor. Contractions may last for hours, days, or even weeks before true labor sets in. If contractions come at regular intervals, intensify, or become painful, it is best to be seen by your caregiver.   SIGNS OF LABOR    Menstrual-like cramps.   Contractions that are 5 minutes apart or less.   Contractions that start on the top of the uterus and spread down to the lower abdomen and back.   A sense of increased pelvic pressure or back pain.   A watery or bloody mucus discharge that comes from the vagina.  If you have any of these signs before the 37th week of pregnancy, call your caregiver right away. You need to go to the hospital to get checked immediately.  HOME CARE INSTRUCTIONS    Avoid all  smoking, herbs, alcohol, and unprescribed drugs. These chemicals affect the formation and growth of the baby.   Follow your caregiver's instructions regarding medicine use. There are medicines that are either safe or unsafe to take during pregnancy.   Exercise only as directed by your caregiver. Experiencing uterine cramps is a good sign to stop exercising.   Continue to eat regular, healthy meals.   Wear a good support bra for breast tenderness.   Do not use hot tubs, steam rooms, or saunas.   Wear your seat belt at all times when driving.   Avoid raw meat, uncooked cheese, cat litter boxes, and soil used by cats. These carry germs that can cause birth defects in the baby.   Take your prenatal vitamins.   Try taking a stool softener (if your caregiver approves) if you develop constipation. Eat more high-fiber foods, such as fresh vegetables or fruit and whole grains. Drink plenty of fluids to keep your urine clear or pale yellow.   Take warm sitz baths to soothe any pain or discomfort caused by hemorrhoids. Use hemorrhoid cream if your caregiver approves.   If you develop varicose veins, wear support hose. Elevate your feet for 15 minutes, 3 4 times a day. Limit salt in your diet.   Avoid heavy lifting, wear low heal shoes, and practice good posture.   Rest a lot with your legs elevated if you have leg cramps or low back pain.   Visit your dentist if you have not gone during your pregnancy. Use a soft toothbrush to brush your teeth and be gentle when you floss.   A sexual relationship may be continued unless your caregiver directs you otherwise.   Do not travel far distances unless it is absolutely necessary and only with the approval of your caregiver.   Take prenatal classes to understand, practice, and ask questions about the labor and delivery.   Make a trial run to the hospital.   Pack your hospital bag.   Prepare the baby's nursery.   Continue to go to all your prenatal visits as directed  by your caregiver.  SEEK MEDICAL CARE IF:   You are unsure if you are in labor or if your water has broken.   You have dizziness.   You have mild pelvic cramps, pelvic pressure, or nagging pain in your abdominal area.   You have persistent nausea, vomiting, or diarrhea.   You have a bad smelling vaginal discharge.   You have pain with urination.  SEEK IMMEDIATE MEDICAL CARE IF:    You have a fever.   You are leaking fluid from your vagina.   You have spotting or bleeding from your vagina.     You have severe abdominal cramping or pain.   You have rapid weight loss or gain.   You have shortness of breath with chest pain.   You notice sudden or extreme swelling of your face, hands, ankles, feet, or legs.   You have not felt your baby move in over an hour.   You have severe headaches that do not go away with medicine.   You have vision changes.  Document Released: 10/08/2001 Document Revised: 06/16/2013 Document Reviewed: 12/15/2012  [employer] Patient Information 2014 [employer].

## 2014-01-03 NOTE — Addendum Note (Signed)
Addended by: Vale HavenBECK, Shady Padron L on: 01/03/2014 03:38 PM   Modules accepted: Orders

## 2014-01-03 NOTE — Progress Notes (Signed)
S: 23 yo G1 @ 6177w4d her efor ROBV - doing well  has been contracting on and off for the last week and wants her cervix checked No lof, vb.   O: see flowsheet  A/P - labor precautions discussed - no concerns -f/u in 1 week

## 2014-01-03 NOTE — Progress Notes (Signed)
Pulse: 86

## 2014-01-11 ENCOUNTER — Encounter (HOSPITAL_COMMUNITY): Payer: Self-pay | Admitting: *Deleted

## 2014-01-11 ENCOUNTER — Inpatient Hospital Stay (HOSPITAL_COMMUNITY)
Admission: AD | Admit: 2014-01-11 | Discharge: 2014-01-13 | DRG: 774 | Disposition: A | Discharge status: Home / Self Care | Payer: BC Managed Care – PPO | Source: Ambulatory Visit | Attending: Family Medicine | Admitting: Family Medicine

## 2014-01-11 ENCOUNTER — Encounter: Payer: Self-pay | Admitting: Obstetrics and Gynecology

## 2014-01-11 ENCOUNTER — Encounter: Payer: BC Managed Care – PPO | Admitting: Family Medicine

## 2014-01-11 DIAGNOSIS — D649 Anemia, unspecified: Secondary | ICD-10-CM | POA: Diagnosis not present

## 2014-01-11 DIAGNOSIS — O4290 Premature rupture of membranes, unspecified as to length of time between rupture and onset of labor, unspecified weeks of gestation: Secondary | ICD-10-CM

## 2014-01-11 DIAGNOSIS — O9903 Anemia complicating the puerperium: Secondary | ICD-10-CM | POA: Diagnosis not present

## 2014-01-11 LAB — CBC
HCT: 32.5 % — ABNORMAL LOW (ref 36.0–46.0)
HEMATOCRIT: 25.8 % — AB (ref 36.0–46.0)
HEMOGLOBIN: 10.4 g/dL — AB (ref 12.0–15.0)
Hemoglobin: 8.1 g/dL — ABNORMAL LOW (ref 12.0–15.0)
MCH: 25.4 pg — AB (ref 26.0–34.0)
MCH: 24.6 pg — ABNORMAL LOW (ref 26.0–34.0)
MCHC: 32 g/dL (ref 30.0–36.0)
MCHC: 31.4 g/dL (ref 30.0–36.0)
MCV: 79.3 fL (ref 78.0–100.0)
MCV: 78.4 fL (ref 78.0–100.0)
PLATELETS: 218 10*3/uL (ref 150–400)
Platelets: 265 10*3/uL (ref 150–400)
RBC: 4.1 MIL/uL (ref 3.87–5.11)
RBC: 3.29 MIL/uL — ABNORMAL LOW (ref 3.87–5.11)
RDW: 15.9 % — ABNORMAL HIGH (ref 11.5–15.5)
RDW: 15.9 % — AB (ref 11.5–15.5)
WBC: 12 10*3/uL — ABNORMAL HIGH (ref 4.0–10.5)
WBC: 21.9 10*3/uL — ABNORMAL HIGH (ref 4.0–10.5)

## 2014-01-11 LAB — TYPE AND SCREEN
ABO/RH(D): B POS
Antibody Screen: NEGATIVE

## 2014-01-11 LAB — ABO/RH: ABO/RH(D): B POS

## 2014-01-11 LAB — RPR: RPR Ser Ql: NONREACTIVE

## 2014-01-11 MED ORDER — LIDOCAINE HCL (PF) 1 % IJ SOLN
30.0000 mL | INTRAMUSCULAR | Status: DC | PRN
  Administered 2014-01-11: 30 mL via SUBCUTANEOUS
  Filled 2014-01-11: qty 30

## 2014-01-11 MED ORDER — BENZOCAINE-MENTHOL 20-0.5 % EX AERO
1.0000 "application " | INHALATION_SPRAY | CUTANEOUS | Status: DC | PRN
  Filled 2014-01-11: qty 56

## 2014-01-11 MED ORDER — SENNOSIDES-DOCUSATE SODIUM 8.6-50 MG PO TABS
2.0000 | ORAL_TABLET | ORAL | Status: DC
  Filled 2014-01-11 (×2): qty 2

## 2014-01-11 MED ORDER — OXYCODONE-ACETAMINOPHEN 5-325 MG PO TABS: 1.0000 | ORAL_TABLET | ORAL | Status: DC | PRN

## 2014-01-11 MED ORDER — DIPHENHYDRAMINE HCL 25 MG PO CAPS: 25.0000 mg | ORAL_CAPSULE | Freq: Four times a day (QID) | ORAL | Status: DC | PRN

## 2014-01-11 MED ORDER — MISOPROSTOL 200 MCG PO TABS
1000.0000 ug | ORAL_TABLET | Freq: Once | ORAL | Status: AC
  Administered 2014-01-11: 1000 ug via RECTAL

## 2014-01-11 MED ORDER — CITRIC ACID-SODIUM CITRATE 334-500 MG/5ML PO SOLN: 30.0000 mL | ORAL | Status: DC | PRN

## 2014-01-11 MED ORDER — ZOLPIDEM TARTRATE 5 MG PO TABS: 5.0000 mg | ORAL_TABLET | Freq: Every evening | ORAL | Status: DC | PRN

## 2014-01-11 MED ORDER — ACETAMINOPHEN 325 MG PO TABS: 650.0000 mg | ORAL_TABLET | ORAL | Status: DC | PRN

## 2014-01-11 MED ORDER — MISOPROSTOL 200 MCG PO TABS
ORAL_TABLET | ORAL | Status: AC
  Administered 2014-01-11: 1000 ug via RECTAL
  Filled 2014-01-11: qty 5

## 2014-01-11 MED ORDER — TETANUS-DIPHTH-ACELL PERTUSSIS 5-2.5-18.5 LF-MCG/0.5 IM SUSP: 0.5000 mL | Freq: Once | INTRAMUSCULAR | Status: DC

## 2014-01-11 MED ORDER — LANOLIN HYDROUS EX OINT: TOPICAL_OINTMENT | CUTANEOUS | Status: DC | PRN

## 2014-01-11 MED ORDER — IBUPROFEN 600 MG PO TABS: 600.0000 mg | ORAL_TABLET | Freq: Four times a day (QID) | ORAL | Status: DC | PRN

## 2014-01-11 MED ORDER — LACTATED RINGERS IV SOLN
500.0000 mL | INTRAVENOUS | Status: DC | PRN
  Administered 2014-01-11: 500 mL via INTRAVENOUS

## 2014-01-11 MED ORDER — OXYTOCIN 40 UNITS IN LACTATED RINGERS INFUSION - SIMPLE MED
62.5000 mL/h | INTRAVENOUS | Status: DC
  Filled 2014-01-11: qty 1000

## 2014-01-11 MED ORDER — ONDANSETRON HCL 4 MG/2ML IJ SOLN: 4.0000 mg | Freq: Four times a day (QID) | INTRAMUSCULAR | Status: DC | PRN

## 2014-01-11 MED ORDER — OXYCODONE-ACETAMINOPHEN 5-325 MG PO TABS
1.0000 | ORAL_TABLET | ORAL | Status: DC | PRN
  Administered 2014-01-11: 1 via ORAL
  Filled 2014-01-11: qty 2

## 2014-01-11 MED ORDER — FENTANYL CITRATE 0.05 MG/ML IJ SOLN
100.0000 ug | INTRAMUSCULAR | Status: DC | PRN
  Administered 2014-01-11 (×3): 100 ug via INTRAVENOUS
  Filled 2014-01-11 (×3): qty 2

## 2014-01-11 MED ORDER — ONDANSETRON HCL 4 MG PO TABS: 4.0000 mg | ORAL_TABLET | ORAL | Status: DC | PRN

## 2014-01-11 MED ORDER — LACTATED RINGERS IV SOLN
INTRAVENOUS | Status: DC
  Administered 2014-01-11 (×3): via INTRAVENOUS

## 2014-01-11 MED ORDER — DIBUCAINE 1 % RE OINT
1.0000 | TOPICAL_OINTMENT | RECTAL | Status: DC | PRN
Start: 2014-01-11 — End: 2014-01-13

## 2014-01-11 MED ORDER — OXYTOCIN BOLUS FROM INFUSION: 500.0000 mL | INTRAVENOUS | Status: DC

## 2014-01-11 MED ORDER — WITCH HAZEL-GLYCERIN EX PADS: 1.0000 "application " | MEDICATED_PAD | CUTANEOUS | Status: DC | PRN

## 2014-01-11 MED ORDER — OXYTOCIN 40 UNITS IN LACTATED RINGERS INFUSION - SIMPLE MED
1.0000 m[IU]/min | INTRAVENOUS | Status: DC
  Administered 2014-01-11: 2 m[IU]/min via INTRAVENOUS

## 2014-01-11 MED ORDER — MISOPROSTOL 25 MCG QUARTER TABLET
25.0000 ug | ORAL_TABLET | ORAL | Status: DC
  Administered 2014-01-11: 25 ug via VAGINAL
  Filled 2014-01-11: qty 0.25

## 2014-01-11 MED ORDER — LACTATED RINGERS IV BOLUS (SEPSIS)
1000.0000 mL | Freq: Once | INTRAVENOUS | Status: AC
  Administered 2014-01-11: 1000 mL via INTRAVENOUS

## 2014-01-11 MED ORDER — TERBUTALINE SULFATE 1 MG/ML IJ SOLN: 0.2500 mg | Freq: Once | INTRAMUSCULAR | Status: DC | PRN

## 2014-01-11 MED ORDER — SIMETHICONE 80 MG PO CHEW: 80.0000 mg | CHEWABLE_TABLET | ORAL | Status: DC | PRN

## 2014-01-11 MED ORDER — PRENATAL MULTIVITAMIN CH
1.0000 | ORAL_TABLET | Freq: Every day | ORAL | Status: DC
  Administered 2014-01-12: 1 via ORAL
  Filled 2014-01-11: qty 1

## 2014-01-11 MED ORDER — PRENATAL MULTIVITAMIN CH: 1.0000 | ORAL_TABLET | Freq: Every day | ORAL | Status: DC

## 2014-01-11 MED ORDER — IBUPROFEN 600 MG PO TABS
600.0000 mg | ORAL_TABLET | Freq: Four times a day (QID) | ORAL | Status: DC
  Administered 2014-01-12 – 2014-01-13 (×5): 600 mg via ORAL
  Filled 2014-01-11 (×6): qty 1

## 2014-01-11 MED ORDER — ONDANSETRON HCL 4 MG/2ML IJ SOLN
4.0000 mg | INTRAMUSCULAR | Status: DC | PRN
  Administered 2014-01-11: 4 mg via INTRAVENOUS
  Filled 2014-01-11: qty 2

## 2014-01-11 NOTE — [facility]U Note (Signed)
PT SAYS WITH FRIEND INTERPRETING-  THAT SROM AT 0040-  CLEAR FLUID.    DENIES UC.Marland Kitchen.    GETS PNC  IN CLINIC DOWNSTAIRS.     VE - CLOSED.    FEELS BABY MOVING..Marland Kitchen

## 2014-01-11 NOTE — Progress Notes (Signed)
Caitlin Fuller is a 23 y.o. G1P0 at 5973w5d by ultrasound admitted for PROM  Subjective: Doing well. Contractions increased in strengthen, but pain improved with IV medication.     Objective: BP 116/73  Pulse 104  Temp(Src) 98 F (36.7 C) (Oral)  Resp 20  Ht 4\' 10"  (1.473 m)  Wt 66.792 kg (147 lb 4 oz)  BMI 30.78 kg/m2  LMP 07/12/2013 I/O last 3 completed shifts: In: 500 [I.V.:500] Out: -    FHT:  FHR: 135 bpm, variability: moderate,  accelerations:  Abscent,  decelerations:  Absent UC:   regular, every 2-3 minutes SVE:   Dilation: 4 Effacement (%): 80 Station: -1;-2 Exam by:: Caitlin EngK. Haynes, RN  Labs: Lab Results  Component Value Date   WBC 12.0* 01/11/2014   HGB 10.4* 01/11/2014   HCT 32.5* 01/11/2014   MCV 79.3 01/11/2014   PLT 265 01/11/2014    Assessment / Plan: Augmentation of labor, progressing well  Labor: Progressing normally and on Pitocin s/p Cytotec x 1 Preeclampsia:  no signs or symptoms of toxicity Fetal Wellbeing:  Category I Pain Control:  Fentanyl I/D:  GBS neg Anticipated MOD:  NSVD  Caitlin Fuller, Caitlin Fuller 01/11/2014, 5:34 PM

## 2014-01-11 NOTE — Progress Notes (Signed)
[employer] interpreter [id number] used while MD present.

## 2014-01-11 NOTE — H&P (Signed)
Caitlin Fuller is a 23 y.o. female presenting for SOL.  ROM at 12:40 this AM. No VB, infrequent ctx. +FM. No HA, ShOB, CP, or RUQ pain. Otherwise pt in usual state of health. Pt speaks vietnamese and has translator in room to assist.  Uncomplicated prenatal course.  History OB History   Grav Para Term Preterm Abortions TAB SAB Ect Mult Living   1              Past Medical History  Diagnosis Date  . Medical history non-contributory    Past Surgical History  Procedure Laterality Date  . No past surgeries     Family History: family history is not on file. Social History:  reports that she has never smoked. She does not have any smokeless tobacco history on file. She reports that she does not drink alcohol or use illicit drugs.   Prenatal Transfer Tool  Maternal Diabetes: No Genetic Screening: Declined Maternal Ultrasounds/Referrals: Normal Fetal Ultrasounds or other Referrals:  None Maternal Substance Abuse:  No Significant Maternal Medications:  None Significant Maternal Lab Results:  Lab values include: Group B Strep negative Other Comments:  None  ROS See HPI  Dilation: 1 Effacement (%): 50 Station: -2 Exam by:: Owens Shark RN Blood pressure 119/71, pulse 130, temperature 98.8 F (37.1 Fuller), temperature source Oral, resp. rate 18, height 4\' 10"  (1.473 m), weight 66.792 kg (147 lb 4 oz), last menstrual period 07/12/2013. Maternal Exam:  Uterine Assessment: Contraction strength is mild.  Contraction frequency is irregular.   Cervix: Cervix evaluated by digital exam.     Fetal Exam Fetal Monitor Review: Mode: ultrasound.   Baseline rate: 150.  Variability: moderate (6-25 bpm).   Pattern: accelerations present and no decelerations.    Fetal State Assessment: Category I - tracings are normal.     Physical Exam  Constitutional: She is oriented to person, place, and time. She appears well-developed and well-nourished. No distress.  HENT:  Head: Normocephalic and  atraumatic.  Cardiovascular: Regular rhythm and normal heart sounds.   tachycardic  Respiratory: Effort normal and breath sounds normal. No respiratory distress.  GI: There is no tenderness. There is no rebound and no guarding.  No RUQ tenderness  Neurological: She is alert and oriented to person, place, and time.  Skin: Skin is warm and dry.  Psychiatric: She has a normal mood and affect. Her behavior is normal.    Prenatal labs: ABO, Rh: B/POS/-- (01/21 1100) Antibody: NEG (01/21 1100) Rubella: 2.22 (01/21 1100) RPR: NON REAC (02/18 1121)  HBsAg: NEGATIVE (01/21 1100)  HIV: NON REACTIVE (02/18 1121)  GBS: Negative (02/18 0000)   Clinic [facility]  Genetic Screen Late to care for first screen. Declined Quad/AFP.  Anatomic US wnl except discrepancy in EDD.  Sent to sono ~20 weeks by LMP with fundal height >24, found to be 33 weeks, EDD adjusted  Glucose Screen 12/15/13  TDaP vaccine Given 12/15/13  Flu vaccine  given   GBS 12/15/13 - Negative  Baby Food breast  Contraception  undecided, probably Depo  Circumcision n/a  Pediatrician [facility]  Support People Husband, younger sister, mother-in-law   Assessment/Plan: 89 y.o. G1P0 @ [redacted]w[redacted]d presents for PROM .   #Labor: PROM; Expect NSVD, continue expectant management. Will augment if no change after 4 hr rupture #Pain: Fentanyl PRN, considering epidural PRN #FWB: Category I #ID:  GBS neg #Feeding: Breast # MOC: Mirena # Tachycardia: improved to 102 after 500 mL bolus, continue trending # [place] speaker  Caitlin Fuller, Caitlin Fuller 01/11/2014, 3:23 AM  I spoke with and examined patient and agree with resident's note and plan of care.  Tawana ScaleMichael Ryan Georgia Delsignore, MD OB Fellow 01/11/2014 4:44 AM

## 2014-01-11 NOTE — Progress Notes (Signed)
Caitlin Fuller is a 23 y.o. G1P0 at 10074w5d by ultrasound admitted for PROM  Subjective: Increase pain with contractions; now requesting pain medications.    Objective: BP 138/96  Pulse 105  Temp(Src) 98.6 F (37 C) (Oral)  Resp 20  Ht 4\' 10"  (1.473 m)  Wt 66.792 kg (147 lb 4 oz)  BMI 30.78 kg/m2  LMP 07/12/2013 I/O last 3 completed shifts: In: 500 [I.V.:500] Out: -    FHT:  FHR: 135 bpm, variability: moderate,  accelerations:  Abscent,  decelerations:  Absent UC:   regular, every 3-5 minutes SVE:   Dilation: 2.5 Effacement (%): 70 Station: -2 Exam by:: Dr. Gayla DossJoyner  Labs: Lab Results  Component Value Date   WBC 12.0* 01/11/2014   HGB 10.4* 01/11/2014   HCT 32.5* 01/11/2014   MCV 79.3 01/11/2014   PLT 265 01/11/2014    Assessment / Plan: Augmentation of labor, progressing well  Labor: Progressing normally and Starting Pitocin s/p Cytotec x 1 Preeclampsia:  no signs or symptoms of toxicity Fetal Wellbeing:  Category I Pain Control:  Fentanyl I/D:  GBS neg Anticipated MOD:  NSVD  Caitlin Fuller, Caitlin Fuller 01/11/2014, 1:51 PM

## 2014-01-11 NOTE — Progress Notes (Signed)
Dr. Margo AyeHall in pts room assessing baby and proceeded to transfer newborn to nursery for further evaluation.

## 2014-01-11 NOTE — Progress Notes (Signed)
Caitlin Fuller is a 23 y.o. G1P0 at 3293w5d by ultrasound admitted for PROM  Subjective: Mild pain, contractions regular q 5mins.   Objective: BP 113/66  Pulse 100  Temp(Src) 98.8 F (37.1 C) (Oral)  Resp 16  Ht 4\' 10"  (1.473 m)  Wt 66.792 kg (147 lb 4 oz)  BMI 30.78 kg/m2  LMP 07/12/2013 I/O last 3 completed shifts: In: 500 [I.V.:500] Out: -    FHT:  FHR: 150 bpm, variability: moderate,  accelerations:  Present,  decelerations:  Absent UC:   regular, every 3-4 minutes SVE:   Dilation: 2 Effacement (%): 50 Station: -2 Exam by:: Dr. Gayla DossJoyner  Labs: Lab Results  Component Value Date   WBC 12.0* 01/11/2014   HGB 10.4* 01/11/2014   HCT 32.5* 01/11/2014   MCV 79.3 01/11/2014   PLT 265 01/11/2014    Assessment / Plan: Augmentation of labor, progressing well  Labor: Progressing normally and Starting Cytotec Preeclampsia:  no signs or symptoms of toxicity Fetal Wellbeing:  Category I Pain Control:  Fentanyl and PRN I/D:  GBS neg Anticipated MOD:  NSVD  Caitlin Fuller, Caitlin Fuller 01/11/2014, 9:19 AM

## 2014-01-11 NOTE — Progress Notes (Signed)
An ChinaHong Katherene Pontohi Azbell is a 23 y.o. G1P0 at 3352w5d by ultrasound admitted for PROM  Subjective: Mild pain, contractions more regular  Objective: BP 114/67  Pulse 107  Temp(Src) 98.5 F (36.9 C) (Oral)  Resp 18  Ht 4\' 10"  (1.473 m)  Wt 66.792 kg (147 lb 4 oz)  BMI 30.78 kg/m2  LMP 07/12/2013   Total I/O In: 500 [I.V.:500] Out: -   FHT:  FHR: 150 bpm, variability: moderate,  accelerations:  Present,  decelerations:  Absent UC:   regular, every 3-4 minutes SVE:   Dilation: 2 Effacement (%): 50 Station: -2 Exam by:: Lorenda PeckAshley Bass RN  Labs: Lab Results  Component Value Date   WBC 12.0* 01/11/2014   HGB 10.4* 01/11/2014   HCT 32.5* 01/11/2014   MCV 79.3 01/11/2014   PLT 265 01/11/2014    Assessment / Plan: Spontaneous labor, progressing normally  Labor: Progressing normally Preeclampsia:  no signs or symptoms of toxicity Fetal Wellbeing:  Category I Pain Control:  Fentanyl and PRN I/D:  GBS neg Anticipated MOD:  NSVD  Michaelene SongHall, Yeraldin Litzenberger C 01/11/2014, 6:22 AM

## 2014-01-12 LAB — CBC
HCT: 23.9 % — ABNORMAL LOW (ref 36.0–46.0)
Hemoglobin: 7.5 g/dL — ABNORMAL LOW (ref 12.0–15.0)
MCH: 24.6 pg — AB (ref 26.0–34.0)
MCHC: 31.4 g/dL (ref 30.0–36.0)
MCV: 78.4 fL (ref 78.0–100.0)
PLATELETS: 210 10*3/uL (ref 150–400)
RBC: 3.05 MIL/uL — AB (ref 3.87–5.11)
RDW: 16.2 % — ABNORMAL HIGH (ref 11.5–15.5)
WBC: 20.4 10*3/uL — ABNORMAL HIGH (ref 4.0–10.5)

## 2014-01-12 MED ORDER — FERROUS SULFATE 325 (65 FE) MG PO TABS
325.0000 mg | ORAL_TABLET | Freq: Every day | ORAL | Status: DC
  Administered 2014-01-12 – 2014-01-13 (×2): 325 mg via ORAL
  Filled 2014-01-12 (×2): qty 1

## 2014-01-12 NOTE — Progress Notes (Signed)
Pt still feels lightheaded. I did not feel confortable getting her up. Patient did not feel the need to void. Placed patient on bedpan but could not voind. In and out cath of 650 mls

## 2014-01-12 NOTE — Lactation Note (Signed)
This note was copied from the chart of Caitlin An [place] Thi Bearce. Lactation Consultation Note      Initial consult with this mom of a term baby, apgars 1/9 - went to NICU for apnea and hypotonia, now doing well, on r/o infection/antibiotics. I did DEP teaching with mom, through the phone line interpreter. Mom had Medicaid, and [facility]. She also has [employer] insurance, so will rent a DEP on discharge to home tomorrow. Hand expressioin taught to mom - drops of colsotrum seen. Mom knows I will assit her with latching her baby in the NICU.  Patient Name: Caitlin Fuller Today's Date: 01/12/2014 Reason for consult: Initial assessment;NICU baby   Maternal Data Formula Feeding for Exclusion: Yes (baby in the NICU) Infant to breast within first hour of birth: No Breastfeeding delayed due to:: Maternal status;Other (comment) (baby later to NICU fr hypotonia and apnea r/o sepsis) Has patient been taught Hand Expression?: Yes Does the patient have breastfeeding experience prior to this delivery?: No  Feeding Feeding Type: Bottle Fed - Formula Nipple Type: Slow - flow Length of feed: 10 min  LATCH Score/Interventions                      Lactation Tools Discussed/Used Tools: Pump Breast pump type: Double-Electric Breast Pump WIC Program: No (fax sent for mom ot get an appointment to apply and get DEP) Pump Review: Setup, frequency, and cleaning;Milk Storage;Other (comment) (hand expression, DEP and premie setting teaching) Initiated by:: bedside RN - not within 6 hours of going to NICU Date initiated:: 01/12/14   Consult Status Consult Status: Follow-up Date: 01/13/14 Follow-up type: In-patient    Alfred LevinsLee, Delois Tolbert Anne 01/12/2014, 3:23 PM

## 2014-01-12 NOTE — Progress Notes (Signed)
Patient still states that she feels lightheaded and does not want to get up. I educated her on the importance of ambulation. She will attempt to during the day.

## 2014-01-12 NOTE — Progress Notes (Signed)
Post Partum Day 1 Subjective: voiding, tolerating PO, + flatus and Not getting out of bed because "uterus hurts, but is getting better". Denies current dizzinesss/lightheadedness. Bleeding improving   Objective: Blood pressure 108/73, pulse 111, temperature 99.4 F (37.4 C), temperature source Oral, resp. rate 16, height 4\' 10"  (1.473 m), weight 66.792 kg (147 lb 4 oz), last menstrual period 07/12/2013, unknown if currently breastfeeding.  Physical Exam:  General: alert, cooperative and no distress Lochia: appropriate Uterine Fundus: firm Incision: na DVT Evaluation: No evidence of DVT seen on physical exam. Negative Homan's sign. No cords or calf tenderness. No significant calf/ankle edema.   Recent Labs  01/11/14 2215 01/12/14 0641  HGB 8.1* 7.5*  HCT 25.8* 23.9*    Assessment/Plan: Plan for discharge tomorrow, Breastfeeding, Lactation consult and Contraception Mirena - Encourage ambulation - lactation consult to assist with pumping - social work consult  Retained placenta manually removed post delivery - Tmax 100.7 10pm, 4 hrs post delivery - Afebrile since - WBC elevated to 21.9 post delivery; decreased this morning 20.4  Anemia - Hbg stable 7.5 < 8.1 (admit) - Continue prenatal vitamins - Start Iron    LOS: 1 day   Wenda LowJoyner, James 01/12/2014, 6:59 AM    I spoke with and examined patient and agree with resident's note and plan of care.  Tawana ScaleMichael Ryan Joey Lierman, MD OB Fellow 01/12/2014 12:49 PM

## 2014-01-13 MED ORDER — IBUPROFEN 600 MG PO TABS: 600.0000 mg | ORAL_TABLET | Freq: Four times a day (QID) | ORAL | Status: AC

## 2014-01-13 MED ORDER — DOCUSATE SODIUM 100 MG PO CAPS: 100.0000 mg | ORAL_CAPSULE | Freq: Two times a day (BID) | ORAL | Status: AC

## 2014-01-13 MED ORDER — TETANUS-DIPHTH-ACELL PERTUSSIS 5-2.5-18.5 LF-MCG/0.5 IM SUSP
0.5000 mL | Freq: Once | INTRAMUSCULAR | Status: AC
  Administered 2014-01-13: 0.5 mL via INTRAMUSCULAR
  Filled 2014-01-13: qty 0.5

## 2014-01-13 MED ORDER — FERROUS SULFATE 325 (65 FE) MG PO TABS: 325.0000 mg | ORAL_TABLET | Freq: Every day | ORAL | Status: AC

## 2014-01-13 NOTE — Lactation Note (Signed)
This note was copied from the chart of Caitlin Fuller. Lactation Consultation Note    Follow up consult with this mom of a NICU baby, now 39 hours post partum.  Interpretation done via phone with family friend who signed paper work for [facility] , for her to serve as their interpreter. Mom has decided to formula feed, and does not want to even try and latch baby while they are both here in the hospital.   Patient Name: Caitlin An Fredderick PhenixHong Thi Welp Today's Date: 01/13/2014 Reason for consult: Follow-up assessment;NICU baby   Maternal Data    Feeding    LATCH Score/Interventions                      Lactation Tools Discussed/Used     Consult Status Consult Status: Complete    Alfred LevinsLee, Inas Avena Anne 01/13/2014, 10:02 AM

## 2014-01-13 NOTE — Discharge Instructions (Signed)
Cho con b (Breastfeeding) Quy?t ??nh cho con b l m?t trong nh?ng l?a ch?n t?t nh?t qu v? c th? lm cho qu v? v con qu v?. S? thay ??i hocmon trong qu trnh mang thai khi?n m v pht tri?n, lm t?ng s? l??ng v kch th??c c?a tuy?n s?a. Nh?ng hocmon ny c?ng cho php cc prtein, ???ng v ch?t bo t? ngu?n cung c?p mu t?o thnh s?a m? trong cc tuy?n s?n xu?t s?a. Hocmon ng?n khng cho ti?t s?a tr??c khi con qu v? sinh ra c?ng nh? khng cho dng s?a ch?y nhanh sau khi sinh. Khi b?t ??u cho con b, suy ngh? v? c?a em b c?ng nh? vi?c b b ho?c khc c th? kch thch vi?c ti?t s?a t? tuy?n s?n xu?t s?a.  L?I CH C?A VI?C CHO CON B ??i v?i em b  S?a ??u (s?a non) gip h? tiu ha c?a b ho?t ??ng t?t h?n.  C cc khng th? trong s?a m? c th? gip em b ch?ng l?i nhi?m trng.  B c t? l? b? b?nh hen suy?n, d? ?ng v h?i ch?ng ??t t? ? tr? s? sinh th?p h?n.  Cc ch?t dinh d??ng trong s?a m? t?t cho tr? s? sinh h?n s?a cng th?c v duy nh?t dnh cho nhu c?u c?a b.  S?a m? c?i thi?n s? pht tri?n tr no c?a b.  Em b t c kh? n?ng b? cc tnh tr?ng khc nh? bo ph th?i th? ?u, hen suy?n hay ti?u ???ng tup 2. ??i v?i qu v?   Cho con b gip hnh thnh m?i quan h? ??c bi?t gi?a qu v? v b.  Cho con b r?t thu?n ti?n. S?a m? lun c s?n ? nhi?t ?? ph h?p v khng m?t chi ph.  Cho con b gip ??t calo v gip qu v? gi?m s? cn ? t?ng trong qu trnh mang thai.  Cho con b lm cho t? cung co tr? l?i kch c? nh? tr??c khi mang thai nhanh h?n v lm ch?m ch?y mu (ch?y mu t? t? cung) sau khi sinh.  Vi?c cho con b gip lm gi?m nguy c? b? ti?u ???ng tup 2, ch?ng long x??ng, ung th? v v ung th? bu?ng tr?ng th?p h?n sau ny. D?U HI?U CHO TH?Y CON QU V? ?I D?u hi?u s?m cho bi?t b ?i.  T?ng s? t?nh to ho?c t?ng ho?t ??ng.  [place] mnh.  C? ??ng ??u t? bn ny sang bn kia.  C? ??ng ??u v m? mi?ng khi vu?t vo gc mi?ng ho?c m (ph?n x? r?).  Ti?ng mt to  h?n, chp mi,  a, th? di ho?c rt ln.  ??a tay ln mi?ng.  Mt ngn tay ho?c bn tay nhi?u h?n. D?u hi?u mu?n cho bi?t b ?i.  Qu?y nh?ng x?.  Khc t?ng h?i. D?u hi?u r nh?t cho bi?t b ?i. D?u hi?u qu ?i c?n lm d?u v an ?i tr??c khi qu v? c th? cho b b ???c. Khng ??i nh?ng d?u hi?u qu ?i nh? sau di?n ra tr??c khi qu v? b?t ??u cho b b:   Lun ??ng ??y.  Khc to.   Khc tht. THNG TIN C? B?N V? VI?C CHO CON B Cho con b B?t ??u  Tm m?t ch? thu?n ti?n ?? ng?i ho?c n?m, ??m ph?n c? v l?ng c?n th?n.  ??t m?t chi?c g?i ho?c cu?n t?m ch?n bn d??i em b v ??t b ln ngang b?u  v c?a qu v? (n?u qu v? ng?i). G?i cho b ???c thi?t k? ??c bi?t ?? h? tr? tay v con c?a qu v? khi qu v? cho con b.  B?o ??m cho b?ng con qu v? h??ng vo b?ng qu v?.  Nh? nhng mt-xa b?u v c?a qu v?. Dng ??u ngn tay xoa bp t? thnh ng?c v? pha nm v theo chuy?n ??ng trn. Vi?c ny thc ??y dng s?a ch?y. Qu v? c th? c?n ti?p t?c ??ng tc ny trong lc cho con b n?u s?a ch?y ch?m.  ?? b?u v b?ng 4 ngn tay ??t bn d??i v v ngn ci ? pha trn nm v. ??m b?o cho cc ngn tay c?a qu v? ??t ? xa nm v v mi?ng b.  Dng ngn tay ho?c nm v c?a qu v? vu?t nh? mi b.  Khi mi?ng b m? ?? r?ng, nhanh chng b? b l?i g?n b?u v c?a qu v?, ??t ton b? nm v v vng s?m m?u quanh nm v (qu?ng v) vo mi?ng b cng nhi?u cng t?t.  T?t h?n l c th? nhn th?y ph?n qu?ng v pha trn mi trn c?a b nhi?u h?n ph?n qu?ng v pha d??i mi d??i c?a b.  L??i b nn ? gi?a l?i d??i c?a b v b?u v c?a qu v?.  ??m b?o cho mi?ng b ???c ??t vo ?ng v? tr xung quanh nm v (ng?m v). Mi b c?n ph?i ng?m ch?t v c?a qu v? v cha ra (l?n ra).  Thng th??ng, con qu v? b kho?ng 2 - 3 pht ?? dng s?a b?t ??u ch?y ra. Ng?m v D?y con qu v? cch ng?m v ?ng cch l r?t quan tr?ng. Ng?m v khng ?ng cch c th? lm nm v ?au v gi?m ngu?n c?p s?a v lm con qu v?  t?ng cn ch?m. Ngoi ra, n?u con qu v? khng ng?m nm v ?ng cch, b c th? nu?t ph?i khng kh trong lc b. ?i?u ny c th? lm cho b hay cu g?t. Lm cho b ? h?i khi chuy?n sang b v bn kia c th? gip lo?i b? khng kh b nu?t ph?i. Tuy nhin, vi?c d?y b ng?m v ?ng cch v?n l cch t?t nh?t ?? trnh kh ch?u khi nu?t ph?i khng kh khi b. D?u hi?u cho th?y b ng?m v ?ng cch:   Ko v ho?c b khng pht ra ti?ng ku, khng lm cho qu v? ?au.  Nghe th?y ti?ng nu?t gi?a 3-4 l?n mt.   S? v?n ??ng c?a c? pha trn v pha tr??c c?a tai b khi mt. Cc d?u hi?u cho th?y con qu v? ch?a ng?m ?ng vo nm v:   Ti?ng b ho?c ti?ng chp chp c?a b khi b.  Nm v ?au. N?u qu v? ngh? b ch?a ng?m v ?ng cch, hy tr??t ngn tay vo gc mi?ng c?a b ?? lm gin ?o?n vi?c b v ??t ngn tay vo gi?a hai l?i c?a b. C? g?ng b?t ??u cho b l?i. Cc d?u hi?u cho th?y cho con b thnh cng D?u hi?u c?a con qu v?:   Gi?m d?n s? l?n mt ho?c ng?ng mt hon ton.  Ng?.  Th? l?ng c? th?.  Gi? l?i m?t l??ng nh? s?a trong mi?ng b.  B t? nh? v ra. D?u hi?u c?a qu v?:  B?u v t?ng ?? s?n ch?c, tr?ng l??ng v kch th??c 1 - 3 gi? sau khi cho  b.  Ng?c m?m ngay l?p t?c sau khi cho b.  T?ng l??ng s?a c?ng nh? vi?c thay ??i ?? ??c v mu c?a s?a sau ngy th? 5 cho con b.  Nm v khng b? ?au, n?t ho?c ch?y mu. Cc d?u hi?u cho th?y con qu v? b ?? s?a.  B lm ??t t nh?t 3 chi?c t trong kho?ng th?i gian 24 gi?. N??c ti?u c?n ph?i trong v c mu vng nh?t khi tr? ???c 5 ngy tu?i.  ?i ngoi t nh?t 3 l?n trong vng 24 gi? khi tr? ???c 5 ngy tu?i. Phn c?n ph?i m?m v c mu vng.  ?i ngoi t nh?t 3 l?n trong vng 24 gi? khi tr? ???c 7 ngy tu?i. Phn c?n ph?i c h?t v c mu vng.  Khng b? gi?m tr?ng l??ng qu 10% tr?ng l??ng lc sinh trong vng 3 ngy ??u tin sau sinh.  T?ng tr?ng l??ng 4 - 7 ao-x? (120-21- mL) m?i tu?n sau 4 ngy tu?i.  T?ng tr?ng l??ng  hng ngy khi tr? ???c 5 ngy tu?i, khng b? gi?m tr?ng l?c sau 2 tu?n tu?i. Sau khi cho b, con qu v? c th? ch? ra m?t t s?a. Vi?c ny l bnh th??ng. T?N SU?T V TH?I GIAN CHO B Cho b th??ng xuyn s? gip qu v? t?o nhi?u s?a h?n v c th? ng?n ng?a c??ng ?au v v lot nm v. Cho b khi qu v? c?m th?y c?n lm gi?m ?? c?ng c?a b?u s?a ho?c khi con qu v? c d?u hi?u ?i. Vi?c ny g?i l "cho b theo nhu c?u". Hessie Diener cho b dng nm v gi? khi qu v? ?ang t?o thi quen cho b s?a m? (4-6 tu?n ??u sau khi sinh). Sau th?i gian ny qu v? c th? l?a ch?n s? d?ng m?t nm v gi?. Nghin c?u cho th?y s? d?ng nm v gi? trong n?m ??u ??i c?a b lm gi?m nguy c? c?a h?i ch?ng ??t t? tr? s? sinh (SIDS). Hy ?? b b trn m?i ng?c cho t?i khi b khng cn mu?n b. Cho con b cho ??n khi b khng b n?a. Khi b khng ng?m v ho?c ng? trong khi b bn b?u v ??u tin, hy cho b b bn th? hai. V tr? s? sinh th??ng hay bu?n ng? trong nh?ng tu?n ??u sau sinh, qu v? c th? c?n ?nh th?c b ?? cho b ?n. Th?i gian cho b khc nhau ty thu?c vo t?ng b. Tuy nhin, nh?ng quy t?c trong ph?n h??ng d?n sau ?y c th? gip qu v? b?o ??m cho con qu v? ???c b ?ng cch.  Tr? s? sinh (tr? t? 4 tu?n tu?i tr? xu?ng) c th? cho b 1 - 3 gi? m?t l?n.  Tr? s? sinh khng nn ?? qu 3 gi? trong ngy ho?c 5 gi? trong ?m m khng cho b.  Qu v? c?n cho con b t nh?t 8 l?n trong 24 gi? cho ??n khi b?t ??u cho b ?n d?m vo kho?ng 6 thng tu?i. B?M HT S?A Ht v tch tr? s?a m? s? gip qu v? b?o ??m r?ng con qu v? ???c cho b hon ton b?ng s?a m?, ngay c? khi qu v? khng th? cho con b. Vi?c ny ??c bi?t quan tr?ng n?u qu v? ?i lm tr? l?i trong khi v?n cho con b ho?c khi qu v? khng th? c m?t ?? cho con b. Chuyn gia t? v?n v? cch cho  b c th? h??ng d?n cho qu v? v? vi?c b?o qu?n s?a an ton trong bao lu.  My ht s?a l m?t d?ng c? cho php qu v? ht s?a t? v vo m?t chai v trng. S?a ? ht ra sau ?  c th? ???c l?u tr? trong t? l?nh ho?c t? lm mt. M?t s? lo?i my ht s?a v?n hnh b?ng tay, trong khi m?t s? my khc s? d?ng ?i?n Hy h?i chuyn gia t? v?n v? cch cho b v? lo?i my thch h?p nh?t cho qu v?. C th? mua my ht s?a, nh?ng m?t s? b?nh vi?n v cc nhm h? tr? cho con b cho thu my ht s?a hng thng. Chuyn gia t? v?n v? cch cho b s? c th? d?y qu v? cch v?t s?a b?ng tay, n?u qu v? khng mu?n s? d?ng my ht.  CH?M [place] V TRONG KHI CHO CON B Nm v c th? tr? nn kh, n?t, v ?au trong khi cho con b. Nh?ng khuy?n ngh? sau c th? gip gi? cho v qu v? ?m v kh?e m?nh:  Hessie Diener s? d?ng x phng trn nm v.  M?c o ng?c h? tr?. M?c d khng c?n thi?t, nh?ng c nh?ng lo?i o ng?c ??c bi?t dnh cho m? cho con b ???c thi?t k? ?? qu v? c th? cho b m khng c?n c?i ton b? o ng?c. Trnh m?c o ng?c c dy kim lo?i ho?c lo?i qu ch?t.  ?? nm v kh t? nhin trong 3-4 pht sau m?i l?n cho b.  Ch? s? d?ng mi?ng ??m o ng?c b?ng bng ?? th?m s?a m? r? ra. R? s?a m? gi?a cc l?n cho b l bnh th??ng.  S? d?ng lanolin bi nm v sau khi cho b. Lanolin gip duy tr l?p ?m bnh th??ng c?a da. N?u qu v? dng lanolin tinh khi?t, qu v? khng c?n r?a nm v tr??c khi cho b l?i. Lanolin tinh khi?t khng ??c h?i cho con qu v?. Qu v? c?ng c th? dng tay v?t vi gi?t s?a v nh? nhng xoa m?y gi?t s?a ? vo nm v v ?? cho s?a kh ngoi khng kh. Trong nh?ng tu?n ??u sau khi sinh, m?t s? ph? n? g?p ph?i tnh tr?ng v r?t c?ng (c??ng s?a). Khi b? c??ng s?a, qu v? c th? c?m th?y ng?c n?ng, ?m ho?c c?m th?y ?au khi s? vo. C??ng s?a nhi?u nh?t l trong th?i gian 3-5 ngy sau khi sinh. Nh?ng khuy?n ngh? sau c th? gip gi?m tnh tr?ng c??ng s?a:  L?y h?t s?a hon ton khi cho b ho?c ht s?a. Qu v? c th? mu?n b?t ??u b?ng vi?c lm ?m, s?c nng ?m (khi t?m b?ng vi hoa sen ho?c b?ng kh?n tay nhng n??c ?m) ngay tr??c khi cho con b ho?c ht s?a. Lm nh? v?y s? t?ng s? l?u thng v  gip s?a ch?y. N?u con qu v? khng b h?t s?a hon ton ? v, hy ht ph?n s?a cn l?i sau khi b thi b.  M?c o ng?c ch?t (lo?i dnh cho b m? cho con b ho?c lo?i th??ng) trong 1-2 ngy ?? pht tn hi?u cho c? th? qu v? gi?m s?n sinh s?a m?t cht.  Ch??m ti ? l?nh vo ng?c, tr? khi vi?c ny qu kh ch?u v?i qu v?.  ??m b?o cho b ng?m v v ???c ??t ? t? th? ph h?p trong khi b. N?u ti?p t?c b? c??ng s?a  sau 48 gi? sau khi lm theo nh?ng khuy?n ngh? ny, hy lin h? chuyn gia ch?m [place] s?c kh?e ho?c chuyn gia t? v?n v? cch cho b. NH?NG KHUY?N N[facility]? CH?M [place] S?C KH?E TRONG KHI CHO CON B  ?n th?c ph?m c l?i cho s?c kh?e. ?n b?a chnh xen k? b?a ph?, 3 b?a chnh v 3 b?a ph? m?i ngy. B?i v th?c ?n m qu v? ?n ?nh h??ng ??n s?a, m?t s? lo?i th?c ph?m c th? lm con qu v? d? b? kch thch h?n lo?i khc. Trnh ?n nh?ng lo?i th?c ph?m ny n?u qu v? ch?c ch?n chng ?nh h??ng x?u t?i con qu v?.  U?ng s?a, n??c p tri cy v n??c ?? th?a mn c?n kht (kho?ng 10 ly m?i ngy).  Ngh? ng?i th??ng xuyn, th? gin v ti?p t?c u?ng vitamin dng tr??c khi sinh ?? ng?n ng?a m?t m?i, c?ng th?ng v thi?u mu.  Ti?p t?c ki?m tra t? nh?n th?c ng?c.  Hessie Diener nhai thu?c l v ht thu?c.  Hessie Diener s? d?ng r??u v ma ty. M?t s? lo?i thu?c c th? c h?i cho con qu v? c th? truy?n qua s?a. ?i?u quan tr?ng l ph?i h?i chuyn gia ch?m [place] s?c kh?e tr??c khi dng b?t k? thu?c g, bao g?m c? thu?c khng c?n k ??n v thu?c k ??n, c?ng nh? vitamin v cc th?c ph?m b? sung th?o d??c. V?n c th? mang thai trong th?i gian cho con b. N?u mu?n trnh [place], hy h?i chuyn gia ch?m [place] s?c kh?e v? nh?ng ph??ng n an ton cho con qu v?. ?I KHM N?U:   Qu v? c?m th?y mu?n ng?ng cho con b ho?c tr? nn th?t v?ng v?i vi?c cho con b.  Qu v? b? ?au ng?c ho?c nm v.  Nm v b? n?t ho?c ch?y mu.  Ng?c c?a qu v? c mu ??, nh?y c?m ?au ho?c ?m.  Qu v? c vng b? s?ng ? m?t bn ng?c.  Qu v? b? s?t ho?c  ?n l?nh.  Qu v? b? bu?n nn ho?c nn m?a.  Qu v? b? r? n??c khc ngoi s?a ? nm v.  Ng?c c?a qu v? khng b? c?ng ??y tr??c khi cho con b lc 5 ngy sau khi sinh.  Qu v? c?m th?y bu?n v chn n?n.  B qu bu?n ng? nn khng ?n ??y ??.  B kh ng?.  B lm ??t d??i 3 chi?c t trong kho?ng th?i gian 24 gi?.  B ??i ti?n d??i 3 l?n trong kho?ng th?i gian 24 gi?.  Da ho?c trng tr?ng c?a m?t b c mu vng.  B khng t?ng cn khi ???c 5 ngy tu?i. NGAY L?P T?C ?I KHM N?U:   Con qu v? qu m?t m?i (ng? l?m) v khng mu?n th?c d?y v khng mu?n ?n.  Con qu v? b? s?t khng r nguyn nhn. Document Released: 10/14/2005 Document Revised: 02/08/2013 [facility] Patient Information 2014 [place], [place].

## 2014-01-13 NOTE — Discharge Summary (Signed)
Obstetric Discharge Summary Reason for Admission: onset of labor Prenatal Procedures: ultrasound Intrapartum Procedures: precipitous vaginal delivery and fragmented placenta Postpartum Procedures: none Complications-Operative and Postpartum: symptomatic anemia Hemoglobin  Date Value Ref Range Status  01/12/2014 7.5* 12.0 - 15.0 g/dL Final     HCT  Date Value Ref Range Status  01/12/2014 23.9* 36.0 - 46.0 % Final    Physical Exam:  General: alert, cooperative and no distress Lochia: appropriate Uterine Fundus: firm Incision: N/A DVT Evaluation: No evidence of DVT seen on physical exam.  Discharge Diagnoses: Term Pregnancy-delivered  Discharge Information: Date: 01/13/2014 Activity: pelvic rest Diet: routine Medications: Ibuprofen Condition: stable Instructions: refer to practice specific booklet Discharge to: home Follow-up Information   [facility] (Discuss Mirena, schedule)    Contact information:   [address] [place] [id number] [phone]     Delivery Note At 6:04 PM a healthy female was delivered via Vaginal, Spontaneous Delivery (Presentation: Middle Occiput Anterior).  APGAR: 1, 9; weight 7 lb 6.7 oz (3365 g).   Placenta status: Abnormal, Manual removal.  Cord: 3 vessels with the following complications: None.  Cord pH: not obtained  Anesthesia: None  Episiotomy: None Lacerations: 2nd degree;Perineal Suture Repair: 3.0 Monocyrl Est. Blood Loss (500mL):   Precipitous delivery as she advanced quickly from 4 cm to complete. Upon arrival baby's head was crowning and mother pushed with good maternal effort to deliver a healthy female. Baby delivered without difficulty, cord was clamped immediately due to poor infant tone and was taken to warmer. APGAR 1 at 1 minute likely due to fentanyl dose given ~ 30 minutes prior to delivery. NICU team was called, but baby responded well to stimulation and minimal bag  ventilation with APGAR 9 at 5 minutes. She had a retained placenta that was manually removed by Dr Shawnie PonsPratt. Placenta was fragmented and required several searches of the uterus to remove retained tissue. 3V cord was noted. She suffered a 2nd degree perineal laceration that was repaired by Dr Shawnie PonsPratt in the usually fashion. EBL was estimated at 500 cc and Cytotec was given. Bleeding was minimal s/p removal of placenta.  Mom to postpartum and baby to skin to skin.   Mom to postpartum.  Baby to Couplet care / Skin to Skin.  Wenda LowJoyner, James 01/11/2014, 7:02 PM  Newborn Data: Live born female  Birth Weight: 7 lb 6.7 oz (3365 g) APGAR: 1, 9  in NICU.  Brief Hospital Course: An ChinaHong Katherene Pontohi Spanier is a G1P1001 who underwent  Precipitous vaginal delivery with placental tear and manual removal and 2nd degree laceration.  For further details, please refer to the delivery/operative note. Postpartum course complicated by symptomatic anemia, but resolved at time of discharge. Pain was controlled on oral pain medications; she had appropriate lochia and was ambulating, voiding without difficulty, tolerating regular diet and passing flatus. She was deemed stable for discharge to home.  Michaelene SongHall, Jonathan C 01/13/2014, 7:38 AM  I have seen and examined this patient and I agree with the above. Cam HaiSHAW, KIMBERLY 8:59 AM 01/13/2014

## 2014-02-11 ENCOUNTER — Encounter: Payer: Self-pay | Admitting: Obstetrics and Gynecology

## 2014-02-11 ENCOUNTER — Ambulatory Visit (INDEPENDENT_AMBULATORY_CARE_PROVIDER_SITE_OTHER): Payer: BC Managed Care – PPO | Admitting: Obstetrics and Gynecology

## 2014-02-11 VITALS — BP 104/74 | HR 88 | Temp 97.9°F | Ht <= 58 in | Wt 121.3 lb

## 2014-02-11 DIAGNOSIS — Z3049 Encounter for surveillance of other contraceptives: Secondary | ICD-10-CM

## 2014-02-11 DIAGNOSIS — R300 Dysuria: Secondary | ICD-10-CM

## 2014-02-11 DIAGNOSIS — IMO0001 Reserved for inherently not codable concepts without codable children: Secondary | ICD-10-CM

## 2014-02-11 DIAGNOSIS — Z01812 Encounter for preprocedural laboratory examination: Secondary | ICD-10-CM

## 2014-02-11 LAB — POCT URINALYSIS DIP (DEVICE)
Bilirubin Urine: NEGATIVE
Glucose, UA: NEGATIVE mg/dL
Hgb urine dipstick: NEGATIVE
Ketones, ur: NEGATIVE mg/dL
Nitrite: NEGATIVE
PH: 5 (ref 5.0–8.0)
Protein, ur: NEGATIVE mg/dL
SPECIFIC GRAVITY, URINE: 1.015 (ref 1.005–1.030)
Urobilinogen, UA: 0.2 mg/dL (ref 0.0–1.0)

## 2014-02-11 MED ORDER — CIPROFLOXACIN HCL 500 MG PO TABS: 500.0000 mg | ORAL_TABLET | Freq: Two times a day (BID) | ORAL | Status: DC

## 2014-02-11 MED ORDER — MEDROXYPROGESTERONE ACETATE 104 MG/0.65ML ~~LOC~~ SUSP
104.0000 mg | Freq: Once | SUBCUTANEOUS | Status: AC
  Administered 2014-02-11: 104 mg via SUBCUTANEOUS

## 2014-02-11 NOTE — Progress Notes (Signed)
  Subjective:     Caitlin Fuller is a 23 y.o. female G1P1001 who presents for a postpartum visit. She is 7 weeks postpartum following a spontaneous vaginal delivery. I have fully reviewed the prenatal and intrapartum course. She had manual removal for retained placenta and hgb drop from 11>7.5. Continues to take PNV and iron supplement. The delivery was at 39.5 gestational weeks. Outcome: spontaneous vaginal delivery. Anesthesia: IV sedation. Postpartum course has been uncomplicated. Baby's course has been uncomplicated. Baby is feeding by breast. Bleeding no bleeding. Lochia lasted 3 wks. Bowel function is normal. Bladder function is normal. Patient is not sexually active. Contraception method is abstinence. Postpartum depression screening: negative.  The following portions of the patient's history were reviewed and updated as appropriate: allergies, current medications, past family history, past medical history, past social history, past surgical history and problem list.  Review of Systems Pertinent items are noted in HPI. She has had a few days of dysuria, not every time she urinates.  Objective:    BP 104/74  Pulse 88  Temp(Src) 97.9 F (36.6 C) (Oral)  Ht 4\' 10"  (1.473 m)  Wt 121 lb 4.8 oz (55.021 kg)  BMI 25.36 kg/m2  General:  alert, cooperative and no distress   Breasts:  inspection negative, no nipple discharge or bleeding, no masses or nodularity palpable  Lungs: clear to auscultation bilaterally  Heart:  regular rate and rhythm, S1, S2 normal, no murmur, click, rub or gallop  Abdomen: soft, non-tender; bowel sounds normal; no masses,  no organomegaly   Vulva:  normal and sttch fragmanet at forchette easily rmoved. Incision healed.   Vagina: normal vagina, no discharge, exudate, lesion, or erythema  Cervix:  no cervical motion tenderness  Corpus: normal size, contour, position, consistency, mobility, non-tender  Adnexa:  no mass, fullness, tenderness  Rectal Exam: Not  performed.        Results for orders placed in visit on 02/11/14 (from the past 24 hour(s))  POCT URINALYSIS DIP (DEVICE)     Status: Abnormal   Collection Time    02/11/14  9:04 AM      Result Value Ref Range   Glucose, UA NEGATIVE  NEGATIVE mg/dL   Bilirubin Urine NEGATIVE  NEGATIVE   Ketones, ur NEGATIVE  NEGATIVE mg/dL   Specific Gravity, Urine 1.015  1.005 - 1.030   Hgb urine dipstick NEGATIVE  NEGATIVE   pH 5.0  5.0 - 8.0   Protein, ur NEGATIVE  NEGATIVE mg/dL   Urobilinogen, UA 0.2  0.0 - 1.0 mg/dL   Nitrite NEGATIVE  NEGATIVE   Leukocytes, UA SMALL (*) NEGATIVE   Assessment:     7 wks postpartum exam. Pap smear not done at today's visit.   Plan:    1. Contraception: Depo-Provera injections> will give today 2. Pap due in Jan 2016 3. Follow up in: 10 months or as needed.  Urine culture sent since sx's equivocal and UA only sm LE, micro pending. . Force fluids.  May stop FeSO4 supplement.

## 2014-02-11 NOTE — Addendum Note (Signed)
Addended by: Candelaria StagersHAIZLIP, Nejla Reasor E on: 02/11/2014 09:45 AM   Modules accepted: Orders

## 2014-02-11 NOTE — Progress Notes (Signed)
Pt. Here today for PP. States she began having burning/pain with urination a few days ago. Urinalysis and culture today. Pt. Would like to start depo provera today; denies having sex since delivery and UPT negative.

## 2014-02-11 NOTE — Progress Notes (Signed)
Patient ID: Caitlin Fuller, female   DOB: 05-28-1991, 23 y.o.   MRN: [id number] Pregnancy test NEGATIVE: not yet loaded in system

## 2014-02-13 LAB — URINE CULTURE
Colony Count: NO GROWTH
ORGANISM ID, BACTERIA: NO GROWTH

## 2014-05-10 ENCOUNTER — Ambulatory Visit (INDEPENDENT_AMBULATORY_CARE_PROVIDER_SITE_OTHER): Payer: BC Managed Care – PPO

## 2014-05-10 VITALS — BP 109/71 | HR 106 | Wt 101.5 lb

## 2014-05-10 DIAGNOSIS — Z3049 Encounter for surveillance of other contraceptives: Secondary | ICD-10-CM

## 2014-05-10 MED ORDER — MEDROXYPROGESTERONE ACETATE 104 MG/0.65ML ~~LOC~~ SUSP
104.0000 mg | Freq: Once | SUBCUTANEOUS | Status: AC
  Administered 2014-05-10: 104 mg via SUBCUTANEOUS

## 2014-08-01 ENCOUNTER — Ambulatory Visit (INDEPENDENT_AMBULATORY_CARE_PROVIDER_SITE_OTHER): Payer: BC Managed Care – PPO | Admitting: General Practice

## 2014-08-01 VITALS — BP 116/69 | HR 90 | Temp 98.2°F | Ht 59.0 in | Wt 101.8 lb

## 2014-08-01 DIAGNOSIS — Z3042 Encounter for surveillance of injectable contraceptive: Secondary | ICD-10-CM | POA: Diagnosis not present

## 2014-08-01 MED ORDER — MEDROXYPROGESTERONE ACETATE 104 MG/0.65ML ~~LOC~~ SUSP
104.0000 mg | Freq: Once | SUBCUTANEOUS | Status: AC
  Administered 2014-08-01: 104 mg via SUBCUTANEOUS

## 2014-08-29 ENCOUNTER — Encounter: Payer: Self-pay | Admitting: Obstetrics and Gynecology

## 2014-10-24 ENCOUNTER — Encounter: Payer: Self-pay | Admitting: *Deleted

## 2014-10-24 ENCOUNTER — Ambulatory Visit (INDEPENDENT_AMBULATORY_CARE_PROVIDER_SITE_OTHER): Payer: BC Managed Care – PPO | Admitting: *Deleted

## 2014-10-24 VITALS — BP 105/62 | HR 93 | Temp 98.0°F | Wt 107.4 lb

## 2014-10-24 DIAGNOSIS — Z3042 Encounter for surveillance of injectable contraceptive: Secondary | ICD-10-CM | POA: Diagnosis not present

## 2014-10-24 MED ORDER — MEDROXYPROGESTERONE ACETATE 104 MG/0.65ML ~~LOC~~ SUSP
104.0000 mg | Freq: Once | SUBCUTANEOUS | Status: AC
  Administered 2014-10-24: 104 mg via SUBCUTANEOUS

## 2014-10-24 NOTE — Progress Notes (Signed)
Interpreter used for encounter

## 2015-01-13 ENCOUNTER — Encounter: Payer: Self-pay | Admitting: *Deleted

## 2015-01-13 ENCOUNTER — Ambulatory Visit (INDEPENDENT_AMBULATORY_CARE_PROVIDER_SITE_OTHER): Payer: BC Managed Care – PPO | Admitting: *Deleted

## 2015-01-13 VITALS — BP 104/60 | HR 86

## 2015-01-13 DIAGNOSIS — Z3042 Encounter for surveillance of injectable contraceptive: Secondary | ICD-10-CM

## 2015-01-13 MED ORDER — MEDROXYPROGESTERONE ACETATE 104 MG/0.65ML ~~LOC~~ SUSP
104.0000 mg | Freq: Once | SUBCUTANEOUS | Status: AC
  Administered 2015-01-13: 104 mg via SUBCUTANEOUS

## 2015-01-13 NOTE — Addendum Note (Signed)
Addended by: Sherre LainASH, Tagan Bartram A on: 01/13/2015 09:22 AM   Modules accepted: Level of Service

## 2015-04-05 ENCOUNTER — Ambulatory Visit: Payer: BC Managed Care – PPO

## 2015-04-20 ENCOUNTER — Ambulatory Visit: Payer: Self-pay

## 2015-04-24 ENCOUNTER — Ambulatory Visit: Payer: BLUE CROSS/BLUE SHIELD

## 2015-04-24 VITALS — BP 120/60 | HR 113 | Wt 114.1 lb

## 2015-04-24 DIAGNOSIS — Z3042 Encounter for surveillance of injectable contraceptive: Secondary | ICD-10-CM

## 2015-04-24 NOTE — Progress Notes (Signed)
Interpreter Jamelle Haring.  Pt here today for Depo Provera.  Pt is late for Depo injection and reports last having intercourse on Friday, June 24th.  I informed pt that per protocol I will have to ask that she reschedule her Depo injection appt for two weeks. I also informed her that she will need to abstain or use a condom with intercourse for the next two weeks.  On her appt scheduled in two weeks a pregnancy test will be initiated and if results negative she will get Depo Provera.  Pt stated understanding and had no further questions.

## 2015-05-08 ENCOUNTER — Ambulatory Visit (INDEPENDENT_AMBULATORY_CARE_PROVIDER_SITE_OTHER): Payer: BLUE CROSS/BLUE SHIELD | Admitting: *Deleted

## 2015-05-08 DIAGNOSIS — Z30013 Encounter for initial prescription of injectable contraceptive: Secondary | ICD-10-CM | POA: Diagnosis not present

## 2015-05-08 DIAGNOSIS — Z3202 Encounter for pregnancy test, result negative: Secondary | ICD-10-CM

## 2015-05-08 LAB — POCT URINALYSIS DIP (DEVICE)
BILIRUBIN URINE: NEGATIVE
GLUCOSE, UA: NEGATIVE mg/dL
Hgb urine dipstick: NEGATIVE
Ketones, ur: NEGATIVE mg/dL
Leukocytes, UA: NEGATIVE
Nitrite: NEGATIVE
PH: 8.5 — AB (ref 5.0–8.0)
PROTEIN: NEGATIVE mg/dL
Specific Gravity, Urine: 1.02 (ref 1.005–1.030)
Urobilinogen, UA: 0.2 mg/dL (ref 0.0–1.0)

## 2015-05-08 LAB — POCT PREGNANCY, URINE: PREG TEST UR: NEGATIVE

## 2015-05-08 MED ORDER — MEDROXYPROGESTERONE ACETATE 150 MG/ML IM SUSP
150.0000 mg | Freq: Once | INTRAMUSCULAR | Status: AC
  Administered 2015-05-08: 150 mg via INTRAMUSCULAR

## 2015-06-30 IMAGING — US US OB COMP +14 WK
1 series · 12 of 28 positions shown · non-contrast
Comparison: none

[Series 1: us ob detail +14 wk · 12 of 103 slices shown]
[im 4/103]
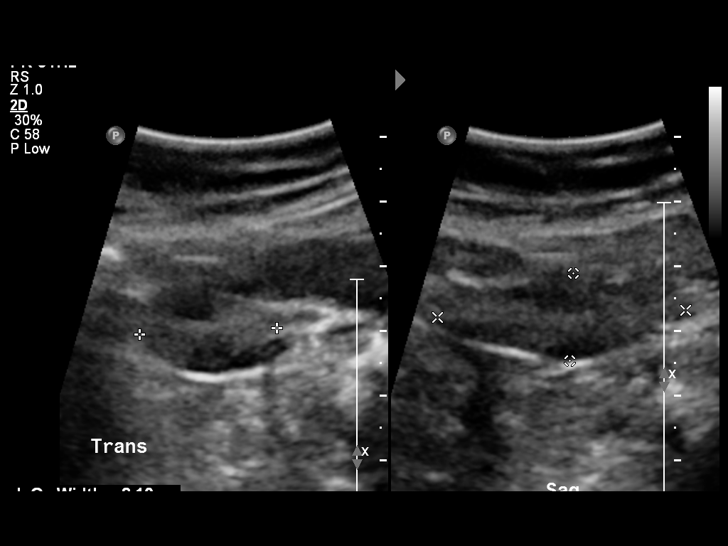
[im 12/103]
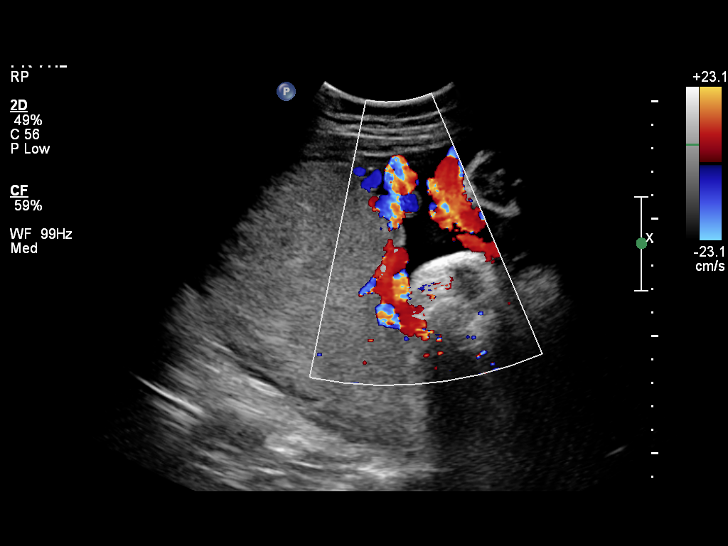
[im 19/103]
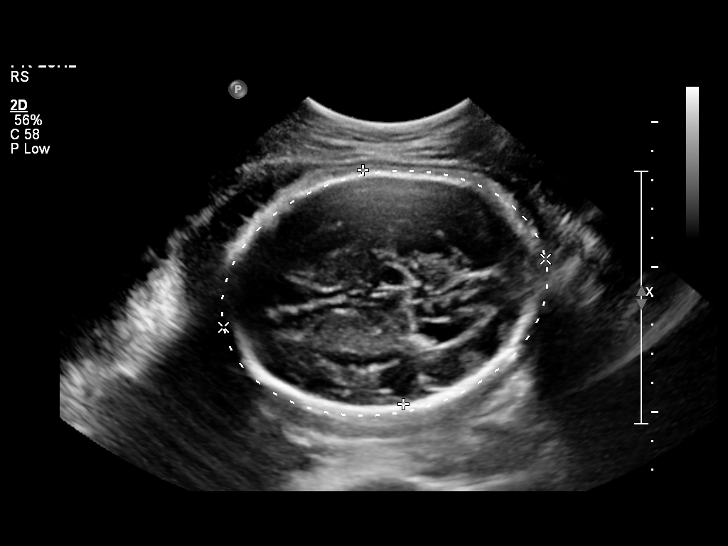
[im 31/103]
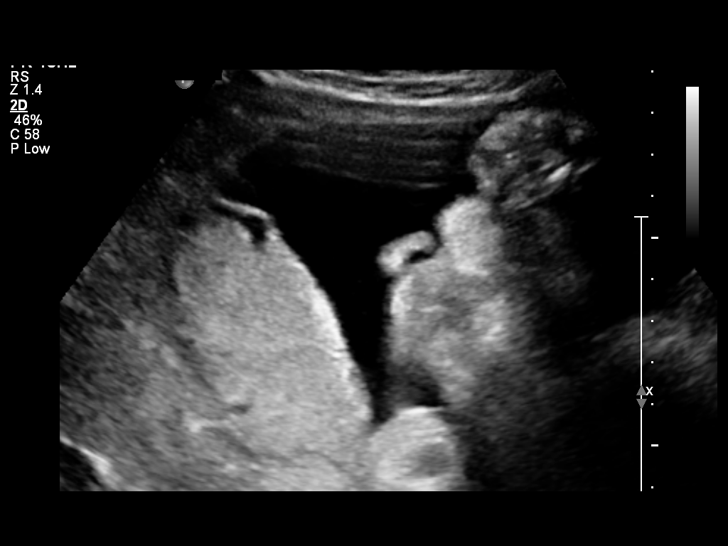
[im 38/103]
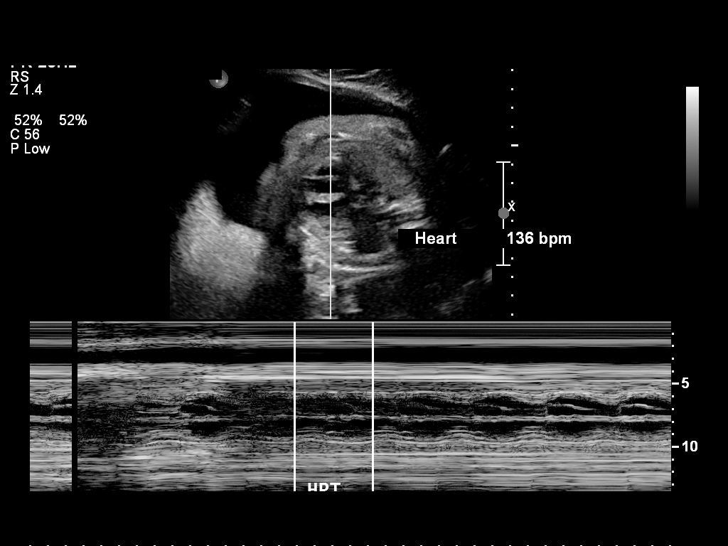
[im 46/103]
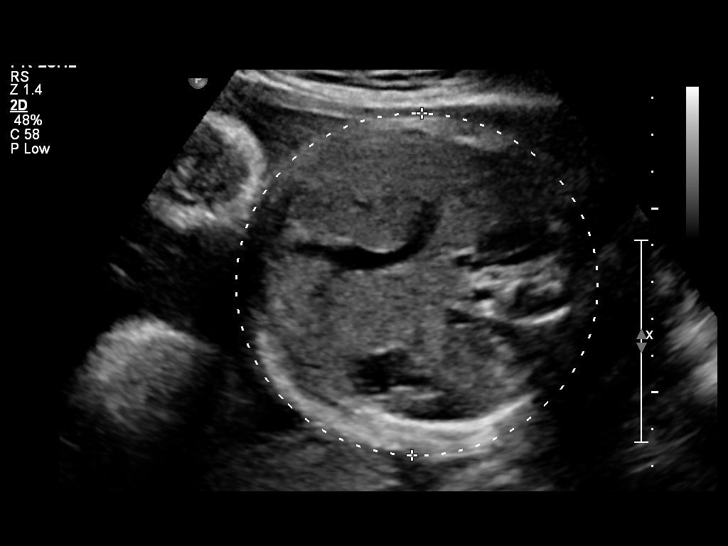
[im 57/103]
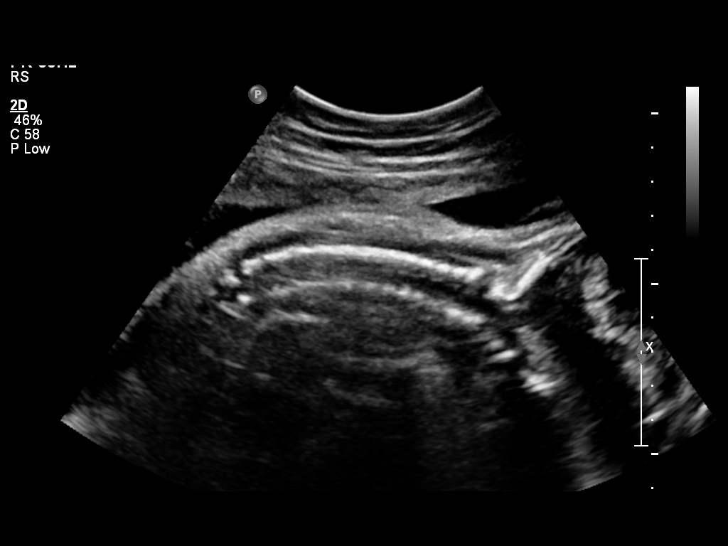
[im 65/103]
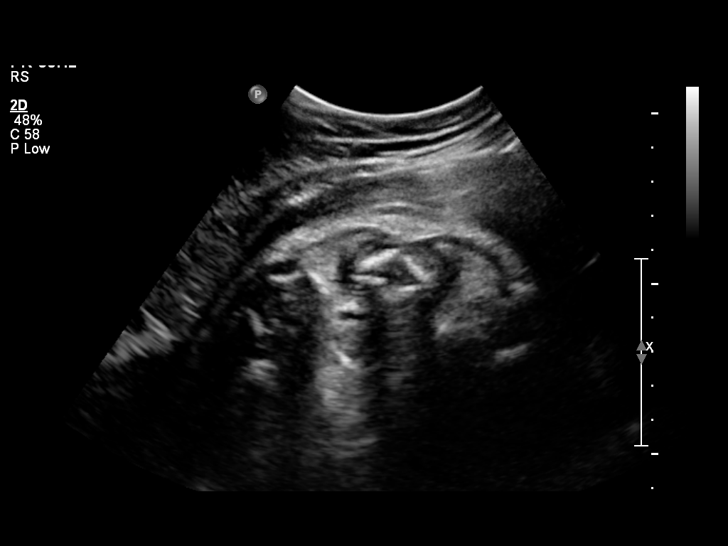
[im 72/103]
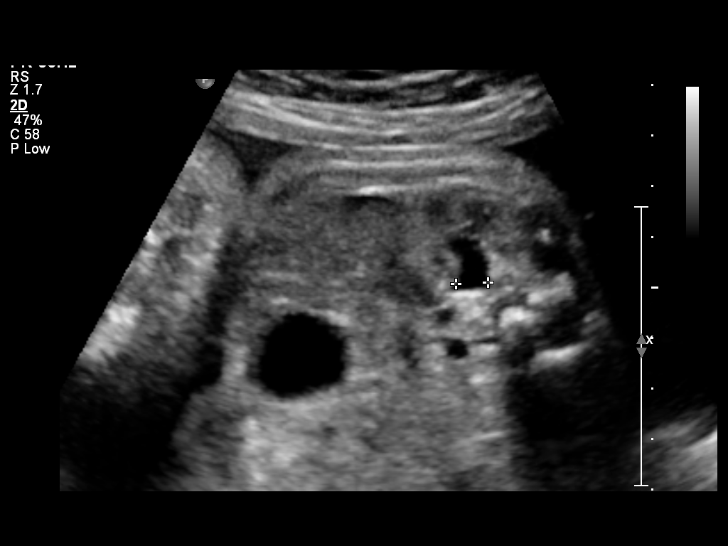
[im 84/103]
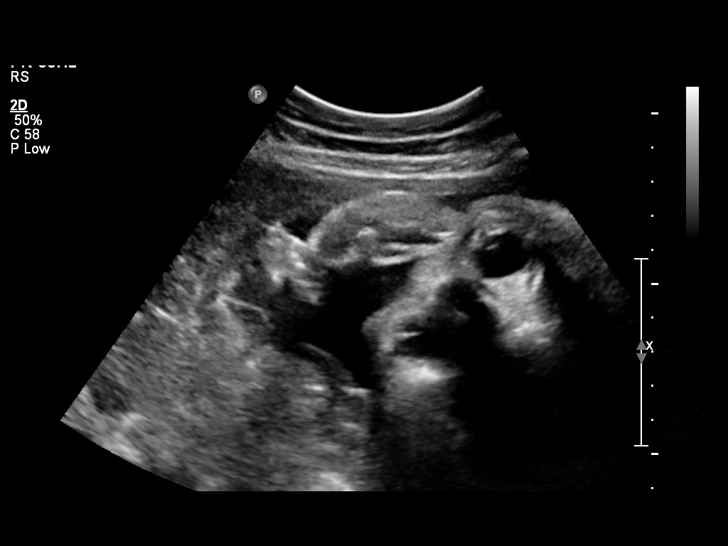
[im 91/103]
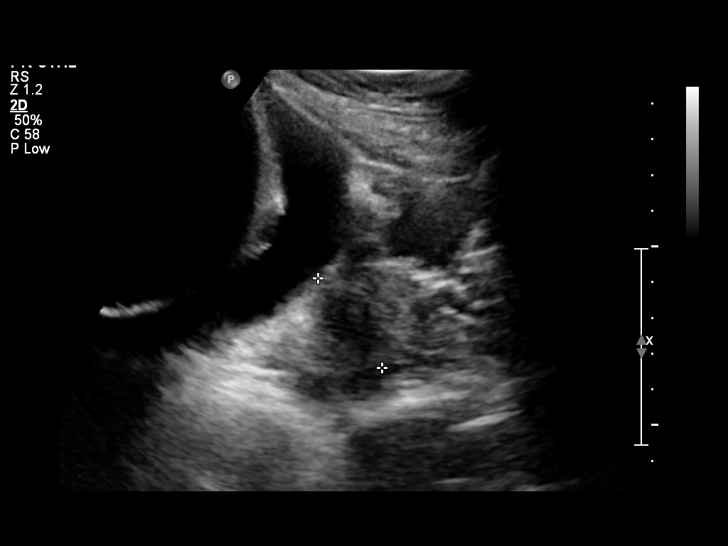
[im 99/103]
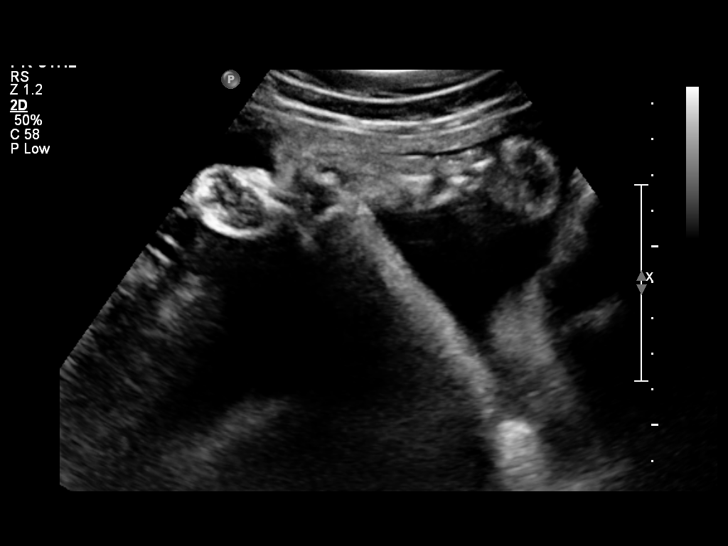

[12 of 28 positions shown; findings below may reference images not displayed]

OBSTETRICS REPORT
                      (Signed Final 11/30/2013 [DATE])

Service(s) Provided

 US OB Comp + 14 WK                                    76805.1
Indications

 Basic anatomic survey
Fetal Evaluation

 Num Of Fetuses:    1
 Fetal Heart Rate:  136                          bpm
 Cardiac Activity:  Observed
 Presentation:      Cephalic
 Placenta:          Posterior, above cervical
                    os
 P. Cord            Visualized, central
 Insertion:

 Amniotic Fluid
 AFI FV:      Subjectively within normal limits
 AFI Sum:     20.2    cm       76  %Tile     Larg Pckt:    7.35  cm
 RUQ:   3.64    cm   RLQ:    5.14   cm    LUQ:   7.35    cm   LLQ:    4.07   cm
Biometry

 BPD:     80.4  mm     G. Age:  32w 2d                CI:        68.61   70 - 86
                                                      FL/HC:      21.2   19.4 -

 HC:     310.2  mm     G. Age:  34w 4d       38  %    HC/AC:      1.03   0.96 -

 AC:       300  mm     G. Age:  34w 0d       60  %    FL/BPD:     81.7   71 - 87
 FL:      65.7  mm     G. Age:  33w 6d       44  %    FL/AC:      21.9   20 - 24
 HUM:     55.7  mm     G. Age:  32w 3d       31  %
 CER:     42.1  mm     G. Age:  36w 1d       77  %

 Est. FW:    9992  gm      5 lb 1 oz     61  %
Gestational Age

 LMP:           20w 1d        Date:  07/12/13                 EDD:   04/18/14
 U/S Today:     33w 5d                                        EDD:   01/13/14
 Best:          33w 5d     Det. By:  U/S (11/30/13)           EDD:   01/13/14
Anatomy
 Cranium:          Appears normal         Aortic Arch:      Not well visualized
 Fetal Cavum:      Appears normal         Ductal Arch:      Not well visualized
 Ventricles:       Appears normal         Diaphragm:        Appears normal
 Choroid Plexus:   Not well visualized    Stomach:          Appears normal, left
                                                            sided
 Cerebellum:       Appears normal         Abdomen:          Appears normal
 Posterior Fossa:  Appears normal         Abdominal Wall:   Not well visualized
 Nuchal Fold:      Not applicable (>20    Cord Vessels:     Appears normal (3
                   wks GA)                                  vessel cord)
 Face:             Appears normal         Kidneys:          Appear normal
                   (orbits and profile)
 Lips:             Appears normal         Bladder:          Appears normal
 Heart:            Appears normal         Spine:            Appears normal
                   (4CH, axis, and
                   situs)
 RVOT:             Not well visualized    Lower             Not well visualized
                                          Extremities:
 LVOT:             Not well visualized    Upper             Appears normal
                                          Extremities:

 Other:  Nasal bone visualized. Fetus appears to be a female. Technically
         difficult due to advanced GA.
Targeted Anatomy

 Fetal Central Nervous System
 Lat. Ventricles:  7.0                    Cisterna Magna:
Cervix Uterus Adnexa

 Cervical Length:    3.1      cm

 Cervix:       Normal appearance by transabdominal scan.
 Uterus:       No abnormality visualized.
 Cul De Sac:   No free fluid seen.

 Left Ovary:    Size(cm) L: 3.8 x W: 2.1 x H: 1.34  Volume(cc):
                Within normal limits.
 Right Ovary:   Size(cm) L: 3.27 x W: 2.31 x H: 2.02  Volume(cc): 8
                Within normal limits.

 Adnexa:     No abnormality visualized. No adnexal mass visualized.
Impression

 IUP at 33+5 weeks
 Normal detailed fetal anatomy; limited heart views, CI and
 extremities
 Normal amniotic fluid volume
 EDC based on today's measurements
Recommendations

 Consider follow-up ultrasound for interval growth and
 completion of the anatomic survey in 
3 weeks

 questions or concerns.

## 2015-07-24 ENCOUNTER — Ambulatory Visit: Payer: BC Managed Care – PPO | Admitting: *Deleted
